# Patient Record
Sex: Female | Born: 1950 | Race: Black or African American | Hispanic: No | Marital: Married | Smoking: Never smoker
Health system: Southern US, Community
[De-identification: ages and names within clinical notes are randomized; demographics above are authoritative.]

## PROBLEM LIST (undated history)

## (undated) DIAGNOSIS — L409 Psoriasis, unspecified: Secondary | ICD-10-CM

## (undated) DIAGNOSIS — D649 Anemia, unspecified: Secondary | ICD-10-CM

## (undated) DIAGNOSIS — C801 Malignant (primary) neoplasm, unspecified: Secondary | ICD-10-CM

## (undated) DIAGNOSIS — E785 Hyperlipidemia, unspecified: Secondary | ICD-10-CM

## (undated) DIAGNOSIS — R010 Benign and innocent cardiac murmurs: Secondary | ICD-10-CM

## (undated) HISTORY — DX: Benign and innocent cardiac murmurs: R01.0

## (undated) HISTORY — PX: TUBAL LIGATION: SHX77

## (undated) HISTORY — DX: Malignant (primary) neoplasm, unspecified: C80.1

## (undated) HISTORY — DX: Hyperlipidemia, unspecified: E78.5

## (undated) HISTORY — PX: OTHER SURGICAL HISTORY: SHX169

## (undated) HISTORY — DX: Anemia, unspecified: D64.9

---

## 2012-08-23 HISTORY — PX: INCISION AND DRAINAGE INTRA ORAL ABSCESS: SHX1802

## 2012-11-13 ENCOUNTER — Other Ambulatory Visit: Payer: Self-pay | Admitting: Adult Health

## 2012-11-30 ENCOUNTER — Ambulatory Visit (INDEPENDENT_AMBULATORY_CARE_PROVIDER_SITE_OTHER): Payer: BC Managed Care – PPO | Admitting: Adult Health

## 2012-11-30 ENCOUNTER — Other Ambulatory Visit (HOSPITAL_COMMUNITY)
Admission: RE | Admit: 2012-11-30 | Discharge: 2012-11-30 | Disposition: A | Discharge status: Home / Self Care | Payer: BC Managed Care – PPO | Source: Ambulatory Visit | Attending: Adult Health | Admitting: Adult Health

## 2012-11-30 ENCOUNTER — Encounter: Payer: Self-pay | Admitting: Adult Health

## 2012-11-30 VITALS — BP 132/90 | HR 88 | Ht 65.0 in | Wt 200.0 lb

## 2012-11-30 DIAGNOSIS — Z01419 Encounter for gynecological examination (general) (routine) without abnormal findings: Secondary | ICD-10-CM | POA: Insufficient documentation

## 2012-11-30 DIAGNOSIS — Z0000 Encounter for general adult medical examination without abnormal findings: Secondary | ICD-10-CM

## 2012-11-30 DIAGNOSIS — Z1151 Encounter for screening for human papillomavirus (HPV): Secondary | ICD-10-CM | POA: Insufficient documentation

## 2012-11-30 DIAGNOSIS — Z1212 Encounter for screening for malignant neoplasm of rectum: Secondary | ICD-10-CM

## 2012-11-30 LAB — HEMOCCULT GUIAC POC 1CARD (OFFICE): Fecal Occult Blood, POC: NEGATIVE

## 2012-11-30 NOTE — Progress Notes (Signed)
Patient ID: Laurie Duarte, female   DOB: 05/14/1951, 62 y.o.   MRN: [id number] History of Present Illness: Laurie Duarte is 62 year old black female, married in today for pap and physical, she said her last one was about 15 years ago. She says she has not had insurance til recently.She is getting a PCP soon.  Current Medications, Allergies, Past Medical History, Past Surgical History, Family History and Social History were reviewed in [employer] record.     Review of Systems:Patient denies any headaches, blurred vision, shortness of breath, chest pain, abdominal pain, problems with bowel movements, urination, or intercourse. She did have recent oral surgery for abscess. Her knees swell if she walks too much, and she does wake up at nite but says it is her husbands fault, because he comes to bed late. No mood changes  Physical Exam:Blood pressure 132/90, pulse 88, height 5\' 5"  (1.651 m), weight 200 lb (90.719 kg).BMI: 33.28. General:  Well developed, well nourished, no acute distress Skin:  Warm and dry Neck:  Midline trachea, normal thyroid, no carotid bruits heard. Lungs; Clear to auscultation bilaterally Breast:  No dominant palpable mass, retraction, or nipple discharge Cardiovascular: Regular rate and rhythm Abdomen:  Soft, non tender, no hepatosplenomegaly Pelvic:  External genitalia is normal in appearance.  The vagina is normal in appearance, she does have loss of rugae but good color and moisture.  The cervix is bulbous. Pap performed with HPV. Uterus is felt to be normal size, shape, and contour.  No adnexal masses or tenderness noted. Rectal: Good sphincter tone, no polyps, or hemorrhoids felt.  Hemoccult negative. Extremities:  No swelling or varicosities noted Psych:  No mood changes   Impression: Yearly exam Post menopausal female  Plan:Mammogram advised now and yearly Colonoscopy advised Order given for fasting labs to consist of CBC, CMP,TSH, Lipid profile to get  in near future. Return to clinic in 1 year for physical

## 2012-11-30 NOTE — Patient Instructions (Addendum)
Get mammogram Think about colonoscopy  Get fasting labs in near future Sign up for my chart.

## 2012-12-04 ENCOUNTER — Other Ambulatory Visit: Payer: Self-pay | Admitting: Adult Health

## 2012-12-04 LAB — COMPREHENSIVE METABOLIC PANEL
ALT: 15 U/L (ref 0–35)
Alkaline Phosphatase: 46 U/L (ref 39–117)
CO2: 26 mEq/L (ref 19–32)
Creat: 0.82 mg/dL (ref 0.50–1.10)
Total Bilirubin: 0.4 mg/dL (ref 0.3–1.2)

## 2012-12-04 LAB — CBC
MCH: 25.5 pg — ABNORMAL LOW (ref 26.0–34.0)
MCHC: 31.8 g/dL (ref 30.0–36.0)
MCV: 80.2 fL (ref 78.0–100.0)
Platelets: 340 10*3/uL (ref 150–400)
RBC: 4.24 MIL/uL (ref 3.87–5.11)
RDW: 15.9 % — ABNORMAL HIGH (ref 11.5–15.5)

## 2012-12-04 LAB — LIPID PANEL
Cholesterol: 226 mg/dL — ABNORMAL HIGH (ref 0–200)
Total CHOL/HDL Ratio: 4.3 Ratio
Triglycerides: 84 mg/dL (ref <150)
VLDL: 17 mg/dL (ref 0–40)

## 2012-12-05 ENCOUNTER — Telehealth: Payer: Self-pay | Admitting: Adult Health

## 2012-12-05 NOTE — Telephone Encounter (Signed)
Called pt and reviewed labs I recommend a multi vitamin with iron and krell oil and increased activity

## 2012-12-05 NOTE — Telephone Encounter (Signed)
Left message to call about lab results.

## 2013-05-07 ENCOUNTER — Other Ambulatory Visit: Payer: Self-pay | Admitting: Gastroenterology

## 2013-05-07 ENCOUNTER — Telehealth (INDEPENDENT_AMBULATORY_CARE_PROVIDER_SITE_OTHER): Payer: Self-pay

## 2013-05-07 ENCOUNTER — Ambulatory Visit
Admission: RE | Admit: 2013-05-07 | Discharge: 2013-05-07 | Disposition: A | Discharge status: Home / Self Care | Payer: BC Managed Care – PPO | Source: Ambulatory Visit | Attending: Gastroenterology | Admitting: Gastroenterology

## 2013-05-07 DIAGNOSIS — K6389 Other specified diseases of intestine: Secondary | ICD-10-CM

## 2013-05-07 DIAGNOSIS — R1031 Right lower quadrant pain: Secondary | ICD-10-CM

## 2013-05-07 LAB — BUN: BUN: 7 mg/dL (ref 6–23)

## 2013-05-07 LAB — CREATININE, SERUM: Creat: 0.7 mg/dL (ref 0.50–1.10)

## 2013-05-07 MED ORDER — IOHEXOL 300 MG/ML  SOLN
125.0000 mL | Freq: Once | INTRAMUSCULAR | Status: AC | PRN
  Administered 2013-05-07: 125 mL via INTRAVENOUS

## 2013-05-07 NOTE — Telephone Encounter (Signed)
Called and spoke to patient with appointment date & time w/Dr. Biagio Quint on 05/11/13 @ 9:45 am.

## 2013-05-07 NOTE — Telephone Encounter (Signed)
Called and attempted to left voice message for Misty Stanley (Dr. Kenna Gilbert nurse) to request medical records on patient.  Left on hold for 6.18 minutes, will attempt at a later time.

## 2013-05-08 ENCOUNTER — Other Ambulatory Visit (INDEPENDENT_AMBULATORY_CARE_PROVIDER_SITE_OTHER): Payer: Self-pay

## 2013-05-09 ENCOUNTER — Other Ambulatory Visit (INDEPENDENT_AMBULATORY_CARE_PROVIDER_SITE_OTHER): Payer: Self-pay

## 2013-05-11 ENCOUNTER — Ambulatory Visit (INDEPENDENT_AMBULATORY_CARE_PROVIDER_SITE_OTHER): Payer: BC Managed Care – PPO | Admitting: General Surgery

## 2013-05-11 ENCOUNTER — Encounter (INDEPENDENT_AMBULATORY_CARE_PROVIDER_SITE_OTHER): Payer: Self-pay | Admitting: General Surgery

## 2013-05-11 VITALS — BP 140/90 | HR 78 | Temp 97.8°F | Resp 14 | Ht 65.0 in | Wt 199.2 lb

## 2013-05-11 DIAGNOSIS — C189 Malignant neoplasm of colon, unspecified: Secondary | ICD-10-CM

## 2013-05-11 DIAGNOSIS — Z86010 Personal history of colonic polyps: Secondary | ICD-10-CM

## 2013-05-11 NOTE — Progress Notes (Signed)
Patient ID: Laurie Duarte, female   DOB: 09/18/1950, 62 y.o.   MRN: [id number]  Chief Complaint  Patient presents with  . New Evaluation    eval colon mass    HPI Laurie Duarte is a 62 y.o. female.  This patient is referred by Dr. 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 These were found on routine screening colonoscopy. She does have a history of lower abdominal pain which had become more concentrated in the right lower quadrant which began around April or May of this year. She described these pains as "labor pains" but these have been resolved now for about 3 weeks since she began taking acidophilus as recommended by her gastroenterologist. She says that her bowels are normally regular moving her bowels about once per day although they have been about every other day more recently. She also says that she has had a change in caliber of her stools to a thinner caliber stool which started in May. She denies any family history of colon cancer or any systemic symptoms such as weight loss. On her colonoscopy earlier this week she was noted to have a large sigmoid colon cancer located about 42-52 cm on the scope and pathology was consistent with malignancy. She was also found to have several other small colon polyps in the rectum which were removed as well as some descending colon polyps and cecal mass which was also biopsied and consistent with tubulovillous adenoma. She has been doing well since her colonoscopy.  She denies any blood in the stool or melena.  Her CEA level was normal. She also had a CT scan of the abdomen and pelvis which demonstrated the known colon masses as well as a right-sided hepatic lesion which was favored to be a hepatic cyst HPI  Past Medical History  Diagnosis Date  . Benign heart murmur   . Anemia   . Cancer     colon  . Hyperlipidemia     Past Surgical History  Procedure Laterality Date  . Tubal ligation    . Polyp removal       nasal cavities  . Incision and drainage intra oral abscess  1 2014    Family History  Problem Relation Age of Onset  . Diabetes Mother   . Hypertension Mother   . Heart disease Mother   . Heart disease Father   . Asthma Father   . Diabetes Maternal Grandmother   . Heart disease Brother   . Lupus Daughter   . Mental illness Daughter     ptsd    Social History History  Substance Use Topics  . Smoking status: Never Smoker   . Smokeless tobacco: Never Used  . Alcohol Use: Yes     Comment: occ.    No Known Allergies  Current Outpatient Prescriptions  Medication Sig Dispense Refill  . ACAI PO Take by mouth.      . calcium-vitamin D (OSCAL WITH D) 500-200 MG-UNIT per tablet Take 1 tablet by mouth.      . COD LIVER OIL PO Take by mouth.      . Lactobacillus (ACIDOPHOLUS PO) Take by mouth.       No current facility-administered medications for this visit.    Review of Systems Review of Systems All other review of systems negative or noncontributory except as stated in the HPI  Blood pressure 140/90, pulse 78, temperature 97.8 F (36.6 C), temperature source Temporal, resp. rate 14, height 5'  5" (1.651 m), weight 199 lb 3.2 oz (90.357 kg).  Physical Exam Physical Exam Physical Exam  Nursing note and vitals reviewed. Constitutional: She is oriented to person, place, and time. She appears well-developed and well-nourished. No distress.  HENT:  Head: Normocephalic and atraumatic.  Mouth/Throat: No oropharyngeal exudate.  Eyes: Conjunctivae and EOM are normal. Pupils are equal, round, and reactive to light. Right eye exhibits no discharge. Left eye exhibits no discharge. No scleral icterus.  Neck: Normal range of motion. Neck supple. No tracheal deviation present.  Cardiovascular: Normal rate, regular rhythm, normal heart sounds and intact distal pulses.   Pulmonary/Chest: Effort normal and breath sounds normal. No stridor. No respiratory distress. She has no wheezes.   Abdominal: Soft. Bowel sounds are normal. She exhibits no distension and no mass. There is no tenderness. There is no rebound and no guarding.  Musculoskeletal: Normal range of motion. She exhibits no edema and no tenderness.  Neurological: She is alert and oriented to person, place, and time.  Skin: Skin is warm and dry. No rash noted. She is not diaphoretic. No erythema. No pallor.  Psychiatric: She has a normal mood and affect. Her behavior is normal. Judgment and thought content normal.    Data Reviewed Cscope report, path, CT scan, outside records  Assessment    Sigmoid colon cancer Right-sided colon polyps She certainly will require sigmoid colectomy for management of this sigmoid colon mass and colon cancer. However, complicating her situation is the right-sided tubulovillous adenomas and she will likely need right hemicolectomy for this as well. I discussed with her the options for 2 segmental resections with 2 anastomoses versus the option of subtotal colectomy and we have decided to proceed with 2 segmental resections with 2 anastomoses. I discussed with her the pros and cons of each options and the risks of each option. I explained that she will have to anastomoses with the risk of leakage of with each anastomosis but think that I would favor this options over the increased morbidity of an infrequent bowel movements associated with subtotal colectomy. I discussed with her the options for laparoscopic or open colectomy and this will most likely require open colectomy but I would consider a laparoscopy as well as especially to evaluate this liver lesion. I discussed with her the risks of surgery including infection, bleeding, pain, scarring, recurrent cancer, anastomotic leak and the possible need for colostomy for diverting ostomy, need for open surgery, injury to the ureter or surrounding structures, and death and she expressed understanding and would like to proceed with sigmoid colectomy  and right hemicolectomy. I would like to again discuss her case with her gastroenterologist as she may require tattoo of the lesions and we could plan colonoscopy with tattooing and subsequent colectomy and I will discuss this with her gastroenterologist prior to scheduling her procedure     Plan    I will discuss with Dr. Loreta Ave the possibility of repeat colonoscopy with tattoo versus going straight to surgery        Aerik Polan DAVID 05/11/2013, 12:20 PM

## 2013-05-14 ENCOUNTER — Telehealth: Payer: Self-pay | Admitting: Adult Health

## 2013-05-14 NOTE — Telephone Encounter (Signed)
Called pt to check on her, had a colonoscopy that showed mass? Colon cancer to have surgery soon with Dr Priscille Heidelberg in [place]

## 2013-05-15 ENCOUNTER — Other Ambulatory Visit (INDEPENDENT_AMBULATORY_CARE_PROVIDER_SITE_OTHER): Payer: Self-pay | Admitting: General Surgery

## 2013-05-15 DIAGNOSIS — K6389 Other specified diseases of intestine: Secondary | ICD-10-CM

## 2013-05-21 ENCOUNTER — Encounter (INDEPENDENT_AMBULATORY_CARE_PROVIDER_SITE_OTHER): Payer: Self-pay

## 2013-05-24 ENCOUNTER — Encounter (HOSPITAL_COMMUNITY): Payer: Self-pay | Admitting: Pharmacy Technician

## 2013-05-25 ENCOUNTER — Encounter (HOSPITAL_COMMUNITY)
Admission: RE | Admit: 2013-05-25 | Discharge: 2013-05-25 | Disposition: A | Discharge status: Home / Self Care | Payer: BC Managed Care – PPO | Source: Ambulatory Visit | Attending: General Surgery | Admitting: General Surgery

## 2013-05-25 ENCOUNTER — Encounter (HOSPITAL_COMMUNITY): Payer: Self-pay

## 2013-05-25 VITALS — BP 135/82 | HR 75 | Temp 97.7°F | Resp 16 | Ht 65.0 in | Wt 200.5 lb

## 2013-05-25 DIAGNOSIS — Z01818 Encounter for other preprocedural examination: Secondary | ICD-10-CM | POA: Insufficient documentation

## 2013-05-25 DIAGNOSIS — Z01812 Encounter for preprocedural laboratory examination: Secondary | ICD-10-CM | POA: Insufficient documentation

## 2013-05-25 DIAGNOSIS — K6389 Other specified diseases of intestine: Secondary | ICD-10-CM

## 2013-05-25 HISTORY — DX: Psoriasis, unspecified: L40.9

## 2013-05-25 LAB — CBC
Platelets: 337 10*3/uL (ref 150–400)
RBC: 4.19 MIL/uL (ref 3.87–5.11)
RDW: 14.8 % (ref 11.5–15.5)
WBC: 7.6 10*3/uL (ref 4.0–10.5)

## 2013-05-25 LAB — BASIC METABOLIC PANEL
CO2: 26 mEq/L (ref 19–32)
Chloride: 104 mEq/L (ref 96–112)
GFR calc Af Amer: >90 mL/min (ref >90)
Sodium: 139 mEq/L (ref 135–145)

## 2013-05-25 NOTE — Patient Instructions (Addendum)
Jalessa Peyser  05/25/2013                           YOUR PROCEDURE IS SCHEDULED ON:  06/01/13               PLEASE REPORT TO SHORT STAY CENTER AT : 10:00 AM               CALL THIS NUMBER IF ANY PROBLEMS THE DAY OF SURGERY :               [phone]                      REMEMBER:   Do not eat food or drink liquids AFTER MIDNIGHT  May have clear liquids UNTIL 6 HOURS BEFORE SURGERY  (6:30 AM)  Clear liquids include soda, tea, black coffee, apple or grape juice, broth.  Take these medicines the morning of surgery with A SIP OF WATER:   Do not wear jewelry, make-up   Do not wear lotions, powders, or perfumes.   Do not shave legs or underarms 12 hrs. before surgery (men may shave face)  Do not bring valuables to the hospital.  Contacts, dentures or bridgework may not be worn into surgery.  Leave suitcase in the car. After surgery it may be brought to your room.  For patients admitted to the hospital more than one night, checkout time is 11:00                          The day of discharge.   Patients discharged the day of surgery will not be allowed to drive home                             If going home same day of surgery, must have someone stay with you first                           24 hrs at home and arrange for some one to drive you home from hospital.    Special Instructions:   Please read over the following fact sheets that you were given:               1. STOP ASPIRIN  AND HERBAL MEDS  7 DAYS PREOP                      2. [place] PREPARING FOR SURGERY SHEET               3. FOLLOW BOWEL PREP                                                X_____________________________________________________________________        Failure to follow these instructions may result in cancellation of your surgery

## 2013-05-31 MED ORDER — SODIUM CHLORIDE 0.9 % IV SOLN
1.0000 g | Freq: Once | INTRAVENOUS | Status: AC
  Administered 2013-06-01: 1 g via INTRAVENOUS
  Filled 2013-05-31: qty 1

## 2013-06-01 ENCOUNTER — Inpatient Hospital Stay (HOSPITAL_COMMUNITY)
Admission: RE | Admit: 2013-06-01 | Discharge: 2013-06-07 | DRG: 149 | Disposition: A | Discharge status: Home / Self Care | Payer: BC Managed Care – PPO | Source: Ambulatory Visit | Attending: General Surgery | Admitting: General Surgery

## 2013-06-01 ENCOUNTER — Encounter (HOSPITAL_COMMUNITY): Payer: BC Managed Care – PPO | Admitting: Anesthesiology

## 2013-06-01 ENCOUNTER — Inpatient Hospital Stay (HOSPITAL_COMMUNITY): Payer: BC Managed Care – PPO | Admitting: Anesthesiology

## 2013-06-01 ENCOUNTER — Encounter (HOSPITAL_COMMUNITY)
Admission: RE | Disposition: A | Discharge status: Home / Self Care | Payer: Self-pay | Source: Ambulatory Visit | Attending: General Surgery

## 2013-06-01 ENCOUNTER — Encounter (HOSPITAL_COMMUNITY): Payer: Self-pay | Admitting: *Deleted

## 2013-06-01 DIAGNOSIS — D126 Benign neoplasm of colon, unspecified: Secondary | ICD-10-CM | POA: Diagnosis present

## 2013-06-01 DIAGNOSIS — Z79899 Other long term (current) drug therapy: Secondary | ICD-10-CM

## 2013-06-01 DIAGNOSIS — K635 Polyp of colon: Secondary | ICD-10-CM | POA: Diagnosis present

## 2013-06-01 DIAGNOSIS — E785 Hyperlipidemia, unspecified: Secondary | ICD-10-CM | POA: Diagnosis present

## 2013-06-01 DIAGNOSIS — Q438 Other specified congenital malformations of intestine: Secondary | ICD-10-CM

## 2013-06-01 DIAGNOSIS — C187 Malignant neoplasm of sigmoid colon: Secondary | ICD-10-CM

## 2013-06-01 DIAGNOSIS — Z860100 Personal history of colon polyps, unspecified: Secondary | ICD-10-CM

## 2013-06-01 DIAGNOSIS — K6389 Other specified diseases of intestine: Secondary | ICD-10-CM

## 2013-06-01 DIAGNOSIS — R011 Cardiac murmur, unspecified: Secondary | ICD-10-CM | POA: Diagnosis present

## 2013-06-01 HISTORY — PX: APPLICATION OF WOUND VAC: SHX5189

## 2013-06-01 HISTORY — PX: PARTIAL COLECTOMY: SHX5273

## 2013-06-01 LAB — TYPE AND SCREEN: ABO/RH(D): A POS

## 2013-06-01 LAB — ABO/RH: ABO/RH(D): A POS

## 2013-06-01 SURGERY — COLECTOMY, PARTIAL
Anesthesia: General | Site: Abdomen | Wound class: Clean Contaminated

## 2013-06-01 MED ORDER — SODIUM CHLORIDE 0.9 % IV SOLN
INTRAVENOUS | Status: AC
  Filled 2013-06-01: qty 1

## 2013-06-01 MED ORDER — ENOXAPARIN SODIUM 40 MG/0.4ML ~~LOC~~ SOLN
40.0000 mg | SUBCUTANEOUS | Status: DC
  Administered 2013-06-02 – 2013-06-07 (×6): 40 mg via SUBCUTANEOUS
  Filled 2013-06-01 (×8): qty 0.4

## 2013-06-01 MED ORDER — PEG 3350-KCL-NA BICARB-NACL 420 G PO SOLR: 4000.0000 mL | Freq: Once | ORAL | Status: DC

## 2013-06-01 MED ORDER — SODIUM CHLORIDE 0.9 % IV SOLN
1.0000 g | Freq: Once | INTRAVENOUS | Status: AC
  Administered 2013-06-02: 1 g via INTRAVENOUS
  Filled 2013-06-01: qty 1

## 2013-06-01 MED ORDER — MORPHINE SULFATE (PF) 1 MG/ML IV SOLN
INTRAVENOUS | Status: AC
  Filled 2013-06-01: qty 25

## 2013-06-01 MED ORDER — PROPOFOL 10 MG/ML IV BOLUS
INTRAVENOUS | Status: DC | PRN
  Administered 2013-06-01: 150 mg via INTRAVENOUS

## 2013-06-01 MED ORDER — ONDANSETRON HCL 4 MG/2ML IJ SOLN: 4.0000 mg | Freq: Four times a day (QID) | INTRAMUSCULAR | Status: DC | PRN

## 2013-06-01 MED ORDER — LACTATED RINGERS IV SOLN
INTRAVENOUS | Status: DC
  Administered 2013-06-01: 1000 mL via INTRAVENOUS

## 2013-06-01 MED ORDER — HYDROMORPHONE HCL PF 1 MG/ML IJ SOLN
0.2500 mg | INTRAMUSCULAR | Status: DC | PRN
  Administered 2013-06-01 (×4): 0.5 mg via INTRAVENOUS

## 2013-06-01 MED ORDER — MIDAZOLAM HCL 5 MG/5ML IJ SOLN
INTRAMUSCULAR | Status: DC | PRN
  Administered 2013-06-01: 2 mg via INTRAVENOUS

## 2013-06-01 MED ORDER — LACTATED RINGERS IV SOLN: INTRAVENOUS | Status: DC

## 2013-06-01 MED ORDER — ONDANSETRON HCL 4 MG/2ML IJ SOLN
INTRAMUSCULAR | Status: DC | PRN
  Administered 2013-06-01: 4 mg via INTRAMUSCULAR

## 2013-06-01 MED ORDER — MORPHINE SULFATE (PF) 1 MG/ML IV SOLN
INTRAVENOUS | Status: DC
  Administered 2013-06-01: 10.5 mg via INTRAVENOUS
  Administered 2013-06-01: 17:00:00 via INTRAVENOUS
  Administered 2013-06-02: 4.03 mg via INTRAVENOUS
  Administered 2013-06-02: 3 mg via INTRAVENOUS
  Administered 2013-06-02 (×2): via INTRAVENOUS
  Administered 2013-06-02: 4.5 mg via INTRAVENOUS
  Administered 2013-06-02: 15 mg via INTRAVENOUS
  Administered 2013-06-02: 6.52 mg via INTRAVENOUS
  Administered 2013-06-02: 4.5 mg via INTRAVENOUS
  Administered 2013-06-03: 7.5 mg via INTRAVENOUS
  Administered 2013-06-03: 10.5 mg via INTRAVENOUS
  Administered 2013-06-03: 7.5 mg via INTRAVENOUS
  Administered 2013-06-03: 10:00:00 via INTRAVENOUS
  Administered 2013-06-03: 4.5 mg via INTRAVENOUS
  Administered 2013-06-03: 6 mg via INTRAVENOUS
  Administered 2013-06-04: 0.597 mg via INTRAVENOUS
  Administered 2013-06-04: 08:00:00 via INTRAVENOUS
  Administered 2013-06-04: 6 mg via INTRAVENOUS
  Administered 2013-06-04: 4.5 mg via INTRAVENOUS
  Filled 2013-06-01 (×4): qty 25

## 2013-06-01 MED ORDER — SODIUM CHLORIDE 0.9 % IR SOLN
Status: DC | PRN
  Administered 2013-06-01: 4000 mL

## 2013-06-01 MED ORDER — NALOXONE HCL 0.4 MG/ML IJ SOLN: 0.4000 mg | INTRAMUSCULAR | Status: DC | PRN

## 2013-06-01 MED ORDER — DIPHENHYDRAMINE HCL 50 MG/ML IJ SOLN: 12.5000 mg | Freq: Four times a day (QID) | INTRAMUSCULAR | Status: DC | PRN

## 2013-06-01 MED ORDER — DIPHENHYDRAMINE HCL 12.5 MG/5ML PO ELIX: 12.5000 mg | ORAL_SOLUTION | Freq: Four times a day (QID) | ORAL | Status: DC | PRN

## 2013-06-01 MED ORDER — KCL IN DEXTROSE-NACL 20-5-0.45 MEQ/L-%-% IV SOLN
INTRAVENOUS | Status: DC
  Administered 2013-06-02: 125 mL/h via INTRAVENOUS
  Administered 2013-06-02: 01:00:00 via INTRAVENOUS
  Filled 2013-06-01 (×3): qty 1000

## 2013-06-01 MED ORDER — NEOSTIGMINE METHYLSULFATE 1 MG/ML IJ SOLN
INTRAMUSCULAR | Status: DC | PRN
  Administered 2013-06-01: 4 mg via INTRAVENOUS

## 2013-06-01 MED ORDER — HEPARIN SODIUM (PORCINE) 5000 UNIT/ML IJ SOLN
5000.0000 [IU] | Freq: Once | INTRAMUSCULAR | Status: AC
  Administered 2013-06-01: 5000 [IU] via SUBCUTANEOUS
  Filled 2013-06-01: qty 1

## 2013-06-01 MED ORDER — FENTANYL CITRATE 0.05 MG/ML IJ SOLN
INTRAMUSCULAR | Status: DC | PRN
  Administered 2013-06-01 (×2): 100 ug via INTRAVENOUS
  Administered 2013-06-01 (×4): 50 ug via INTRAVENOUS
  Administered 2013-06-01: 100 ug via INTRAVENOUS

## 2013-06-01 MED ORDER — CHLORHEXIDINE GLUCONATE 4 % EX LIQD
1.0000 "application " | Freq: Once | CUTANEOUS | Status: DC
  Filled 2013-06-01: qty 15

## 2013-06-01 MED ORDER — ROCURONIUM BROMIDE 100 MG/10ML IV SOLN
INTRAVENOUS | Status: DC | PRN
  Administered 2013-06-01: 10 mg via INTRAVENOUS
  Administered 2013-06-01: 20 mg via INTRAVENOUS
  Administered 2013-06-01: 50 mg via INTRAVENOUS

## 2013-06-01 MED ORDER — ONDANSETRON HCL 4 MG PO TABS: 4.0000 mg | ORAL_TABLET | Freq: Four times a day (QID) | ORAL | Status: DC | PRN

## 2013-06-01 MED ORDER — HYDROMORPHONE HCL PF 1 MG/ML IJ SOLN
INTRAMUSCULAR | Status: AC
  Filled 2013-06-01: qty 1

## 2013-06-01 MED ORDER — SODIUM CHLORIDE 0.9 % IJ SOLN: 9.0000 mL | INTRAMUSCULAR | Status: DC | PRN

## 2013-06-01 MED ORDER — LACTATED RINGERS IV SOLN
INTRAVENOUS | Status: DC | PRN
  Administered 2013-06-01 (×3): via INTRAVENOUS

## 2013-06-01 MED ORDER — GLYCOPYRROLATE 0.2 MG/ML IJ SOLN
INTRAMUSCULAR | Status: DC | PRN
  Administered 2013-06-01: 0.6 mg via INTRAVENOUS

## 2013-06-01 SURGICAL SUPPLY — 57 items
APPLICATOR COTTON TIP 6IN STRL (MISCELLANEOUS) IMPLANT
BLADE EXTENDED COATED 6.5IN (ELECTRODE) ×2 IMPLANT
BLADE HEX COATED 2.75 (ELECTRODE) ×2 IMPLANT
BLADE SURG SZ10 CARB STEEL (BLADE) ×2 IMPLANT
CANISTER SUCTION 2500CC (MISCELLANEOUS) ×4 IMPLANT
CLIP TI LARGE 6 (CLIP) IMPLANT
CLOTH BEACON ORANGE TIMEOUT ST (SAFETY) IMPLANT
COVER MAYO STAND STRL (DRAPES) ×2 IMPLANT
DRAPE LAPAROSCOPIC ABDOMINAL (DRAPES) ×2 IMPLANT
DRAPE LG THREE QUARTER DISP (DRAPES) IMPLANT
DRAPE WARM FLUID 44X44 (DRAPE) ×2 IMPLANT
DRSG PAD ABDOMINAL 8X10 ST (GAUZE/BANDAGES/DRESSINGS) IMPLANT
DRSG VAC ATS SM SENSATRAC (GAUZE/BANDAGES/DRESSINGS) ×2 IMPLANT
ELECT REM PT RETURN 9FT ADLT (ELECTROSURGICAL) ×2
ELECTRODE REM PT RTRN 9FT ADLT (ELECTROSURGICAL) ×1 IMPLANT
ENSEAL DEVICE STD TIP 35CM (ENDOMECHANICALS) ×2 IMPLANT
GAUZE XEROFORM 5X9 LF (GAUZE/BANDAGES/DRESSINGS) ×2 IMPLANT
GLOVE BIOGEL PI IND STRL 7.0 (GLOVE) ×5 IMPLANT
GLOVE BIOGEL PI INDICATOR 7.0 (GLOVE) ×5
GLOVE SURG SIGNA 7.5 PF LTX (GLOVE) ×8 IMPLANT
GLOVE SURG SS PI 7.5 STRL IVOR (GLOVE) ×10 IMPLANT
GOWN PREVENTION PLUS LG XLONG (DISPOSABLE) ×4 IMPLANT
GOWN SRG XL XLNG 56XLVL 4 (GOWN DISPOSABLE) ×3 IMPLANT
GOWN STRL NON-REIN XL XLG LVL4 (GOWN DISPOSABLE) ×3
GOWN STRL REIN 2XL XLG LVL4 (GOWN DISPOSABLE) ×4 IMPLANT
GOWN STRL REIN XL XLG (GOWN DISPOSABLE) ×16 IMPLANT
KIT BASIN OR (CUSTOM PROCEDURE TRAY) ×2 IMPLANT
LEGGING LITHOTOMY PAIR STRL (DRAPES) ×2 IMPLANT
LIGASURE IMPACT 36 18CM CVD LR (INSTRUMENTS) ×2 IMPLANT
NS IRRIG 1000ML POUR BTL (IV SOLUTION) ×8 IMPLANT
PACK GENERAL/GYN (CUSTOM PROCEDURE TRAY) ×2 IMPLANT
RELOAD AUTO 90-3.5 TA90 BLE (ENDOMECHANICALS) ×4 IMPLANT
RELOAD PROXIMATE 75MM BLUE (ENDOMECHANICALS) ×8 IMPLANT
SCALPEL HARMONIC ACE (MISCELLANEOUS) IMPLANT
SPONGE GAUZE 4X4 12PLY (GAUZE/BANDAGES/DRESSINGS) IMPLANT
STAPLER 90 3.5 STAND SLIM (STAPLE) ×2
STAPLER 90 3.5 STD SLIM (STAPLE) ×1 IMPLANT
STAPLER PROXIMATE 75MM BLUE (STAPLE) ×4 IMPLANT
STAPLER VISISTAT 35W (STAPLE) ×4 IMPLANT
SUCTION POOLE TIP (SUCTIONS) ×2 IMPLANT
SUT NOV 1 T60/GS (SUTURE) IMPLANT
SUT NOVA NAB DX-16 0-1 5-0 T12 (SUTURE) IMPLANT
SUT NOVA T20/GS 25 (SUTURE) IMPLANT
SUT PDS AB 1 TP1 96 (SUTURE) ×4 IMPLANT
SUT SILK 2 0 (SUTURE) ×1
SUT SILK 2 0 SH (SUTURE) ×2 IMPLANT
SUT SILK 2 0 SH CR/8 (SUTURE) ×8 IMPLANT
SUT SILK 2 0SH CR/8 30 (SUTURE) IMPLANT
SUT SILK 2-0 18XBRD TIE 12 (SUTURE) ×1 IMPLANT
SUT SILK 2-0 30XBRD TIE 12 (SUTURE) IMPLANT
SUT SILK 3 0 (SUTURE)
SUT SILK 3 0 SH CR/8 (SUTURE) ×2 IMPLANT
SUT SILK 3-0 18XBRD TIE 12 (SUTURE) IMPLANT
TOWEL OR 17X26 10 PK STRL BLUE (TOWEL DISPOSABLE) ×6 IMPLANT
TRAY FOLEY CATH 14FRSI W/METER (CATHETERS) ×2 IMPLANT
WATER STERILE IRR 1500ML POUR (IV SOLUTION) IMPLANT
YANKAUER SUCT BULB TIP NO VENT (SUCTIONS) ×2 IMPLANT

## 2013-06-01 NOTE — Anesthesia Preprocedure Evaluation (Addendum)
Anesthesia Evaluation  Patient identified by MRN, date of birth, ID band Patient awake    Reviewed: Allergy & Precautions, H&P , NPO status , Patient's Chart, lab work & pertinent test results  Airway Mallampati: II TM Distance: >3 FB Neck ROM: full    Dental  (+) Edentulous Upper, Missing and Dental Advisory Given Missing lower front teeth:   Pulmonary neg pulmonary ROS,  breath sounds clear to auscultation  Pulmonary exam normal       Cardiovascular Exercise Tolerance: Good negative cardio ROS  Rhythm:regular Rate:Normal     Neuro/Psych negative neurological ROS  negative psych ROS   GI/Hepatic negative GI ROS, Neg liver ROS,   Endo/Other  negative endocrine ROS  Renal/GU negative Renal ROS  negative genitourinary   Musculoskeletal   Abdominal   Peds  Hematology negative hematology ROS (+)   Anesthesia Other Findings   Reproductive/Obstetrics negative OB ROS                          Anesthesia Physical Anesthesia Plan  ASA: I  Anesthesia Plan: General   Post-op Pain Management:    Induction: Intravenous  Airway Management Planned: Oral ETT  Additional Equipment:   Intra-op Plan:   Post-operative Plan: Extubation in OR  Informed Consent: I have reviewed the patients History and Physical, chart, labs and discussed the procedure including the risks, benefits and alternatives for the proposed anesthesia with the patient or authorized representative who has indicated his/her understanding and acceptance.   Dental Advisory Given  Plan Discussed with: CRNA and Surgeon  Anesthesia Plan Comments:         Anesthesia Quick Evaluation

## 2013-06-01 NOTE — Preoperative (Signed)
Beta Blockers   Reason not to administer Beta Blockers:Not Applicable 

## 2013-06-01 NOTE — Transfer of Care (Signed)
Immediate Anesthesia Transfer of Care Note  Patient: Laurie Duarte  Procedure(s) Performed: Procedure(s): SIGMOID COLECTOMY AND RIGHT  COLECTOMY (N/A) APPLICATION OF WOUND VAC (N/A)  Patient Location: PACU  Anesthesia Type:General  Level of Consciousness: awake, alert  and oriented  Airway & Oxygen Therapy: Patient Spontanous Breathing and Patient connected to face mask oxygen  Post-op Assessment: Report given to PACU RN and Post -op Vital signs reviewed and stable  Post vital signs: Reviewed and stable  Complications: No apparent anesthesia complications

## 2013-06-01 NOTE — H&P (View-Only) (Signed)
Patient ID: Laurie Duarte, female   DOB: Jan 08, 1951, 62 y.o.   MRN: [id number]  Chief Complaint  Patient presents with  . New Evaluation    eval colon mass    HPI Laurie Duarte is a 62 y.o. female.  This patient is referred by Dr. 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 These were found on routine screening colonoscopy. She does have a history of lower abdominal pain which had become more concentrated in the right lower quadrant which began around April or May of this year. She described these pains as "labor pains" but these have been resolved now for about 3 weeks since she began taking acidophilus as recommended by her gastroenterologist. She says that her bowels are normally regular moving her bowels about once per day although they have been about every other day more recently. She also says that she has had a change in caliber of her stools to a thinner caliber stool which started in May. She denies any family history of colon cancer or any systemic symptoms such as weight loss. On her colonoscopy earlier this week she was noted to have a large sigmoid colon cancer located about 42-52 cm on the scope and pathology was consistent with malignancy. She was also found to have several other small colon polyps in the rectum which were removed as well as some descending colon polyps and cecal mass which was also biopsied and consistent with tubulovillous adenoma. She has been doing well since her colonoscopy.  She denies any blood in the stool or melena.  Her CEA level was normal. She also had a CT scan of the abdomen and pelvis which demonstrated the known colon masses as well as a right-sided hepatic lesion which was favored to be a hepatic cyst HPI  Past Medical History  Diagnosis Date  . Benign heart murmur   . Anemia   . Cancer     colon  . Hyperlipidemia     Past Surgical History  Procedure Laterality Date  . Tubal ligation    . Polyp removal       nasal cavities  . Incision and drainage intra oral abscess  1 2014    Family History  Problem Relation Age of Onset  . Diabetes Mother   . Hypertension Mother   . Heart disease Mother   . Heart disease Father   . Asthma Father   . Diabetes Maternal Grandmother   . Heart disease Brother   . Lupus Daughter   . Mental illness Daughter     ptsd    Social History History  Substance Use Topics  . Smoking status: Never Smoker   . Smokeless tobacco: Never Used  . Alcohol Use: Yes     Comment: occ.    No Known Allergies  Current Outpatient Prescriptions  Medication Sig Dispense Refill  . ACAI PO Take by mouth.      . calcium-vitamin D (OSCAL WITH D) 500-200 MG-UNIT per tablet Take 1 tablet by mouth.      . COD LIVER OIL PO Take by mouth.      . Lactobacillus (ACIDOPHOLUS PO) Take by mouth.       No current facility-administered medications for this visit.    Review of Systems Review of Systems All other review of systems negative or noncontributory except as stated in the HPI  Blood pressure 140/90, pulse 78, temperature 97.8 F (36.6 C), temperature source Temporal, resp. rate 14, height 5'  5" (1.651 m), weight 199 lb 3.2 oz (90.357 kg).  Physical Exam Physical Exam Physical Exam  Nursing note and vitals reviewed. Constitutional: She is oriented to person, place, and time. She appears well-developed and well-nourished. No distress.  HENT:  Head: Normocephalic and atraumatic.  Mouth/Throat: No oropharyngeal exudate.  Eyes: Conjunctivae and EOM are normal. Pupils are equal, round, and reactive to light. Right eye exhibits no discharge. Left eye exhibits no discharge. No scleral icterus.  Neck: Normal range of motion. Neck supple. No tracheal deviation present.  Cardiovascular: Normal rate, regular rhythm, normal heart sounds and intact distal pulses.   Pulmonary/Chest: Effort normal and breath sounds normal. No stridor. No respiratory distress. She has no wheezes.   Abdominal: Soft. Bowel sounds are normal. She exhibits no distension and no mass. There is no tenderness. There is no rebound and no guarding.  Musculoskeletal: Normal range of motion. She exhibits no edema and no tenderness.  Neurological: She is alert and oriented to person, place, and time.  Skin: Skin is warm and dry. No rash noted. She is not diaphoretic. No erythema. No pallor.  Psychiatric: She has a normal mood and affect. Her behavior is normal. Judgment and thought content normal.    Data Reviewed Cscope report, path, CT scan, outside records  Assessment    Sigmoid colon cancer Right-sided colon polyps She certainly will require sigmoid colectomy for management of this sigmoid colon mass and colon cancer. However, complicating her situation is the right-sided tubulovillous adenomas and she will likely need right hemicolectomy for this as well. I discussed with her the options for 2 segmental resections with 2 anastomoses versus the option of subtotal colectomy and we have decided to proceed with 2 segmental resections with 2 anastomoses. I discussed with her the pros and cons of each options and the risks of each option. I explained that she will have to anastomoses with the risk of leakage of with each anastomosis but think that I would favor this options over the increased morbidity of an infrequent bowel movements associated with subtotal colectomy. I discussed with her the options for laparoscopic or open colectomy and this will most likely require open colectomy but I would consider a laparoscopy as well as especially to evaluate this liver lesion. I discussed with her the risks of surgery including infection, bleeding, pain, scarring, recurrent cancer, anastomotic leak and the possible need for colostomy for diverting ostomy, need for open surgery, injury to the ureter or surrounding structures, and death and she expressed understanding and would like to proceed with sigmoid colectomy  and right hemicolectomy. I would like to again discuss her case with her gastroenterologist as she may require tattoo of the lesions and we could plan colonoscopy with tattooing and subsequent colectomy and I will discuss this with her gastroenterologist prior to scheduling her procedure     Plan    I will discuss with Dr. Loreta Ave the possibility of repeat colonoscopy with tattoo versus going straight to surgery        Samaria Anes DAVID 05/11/2013, 12:20 PM

## 2013-06-01 NOTE — Brief Op Note (Signed)
06/01/2013  4:18 PM  PATIENT:  Laurie Duarte  63 y.o. female  PRE-OPERATIVE DIAGNOSIS:  colon cancer  POST-OPERATIVE DIAGNOSIS:  COLON CANCER  PROCEDURE:  Procedure(s): SIGMOID COLECTOMY AND RIGHT  COLECTOMY (N/A) APPLICATION OF WOUND VAC (N/A)  SURGEON:  Surgeon(s) and Role:    * Shelly Rubenstein, MD - Assisting    * Lodema Pilot, DO - Primary  PHYSICIAN ASSISTANT:   ASSISTANTS: [place]   ANESTHESIA:   general  EBL:  Total I/O In: 2000 [I.V.:2000] Out: 350 [Urine:300; Blood:50]  BLOOD ADMINISTERED:none  DRAINS: incisional wound vac   LOCAL MEDICATIONS USED:  NONE  SPECIMEN:  Source of Specimen:  sigmoid colectomy (suture proximal), ileocecectomy and appendix  DISPOSITION OF SPECIMEN:  PATHOLOGY  COUNTS:  YES  TOURNIQUET:  * No tourniquets in log *  DICTATION: .Other Dictation: Dictation Number dictated  PLAN OF CARE: Admit to inpatient   PATIENT DISPOSITION:  PACU - hemodynamically stable.   Delay start of Pharmacological VTE agent (>24hrs) due to surgical blood loss or risk of bleeding: no

## 2013-06-01 NOTE — Interval H&P Note (Signed)
History and Physical Interval Note:  06/01/2013 1:19 PM  Laurie Duarte  has presented today for surgery, with the diagnosis of colon cancer  The various methods of treatment have been discussed with the patient and family. After consideration of risks, benefits and other options for treatment, the patient has consented to  Procedure(s): SIGMOID COLECTOMY AND RIGHT  COLECTOMY (N/A) as a surgical intervention .  The patient's history has been reviewed, patient examined, no change in status, stable for surgery.  I have reviewed the patient's chart and labs.  Questions were answered to the patient's satisfaction.  I have seen and evaluated the patient in the preop area.  Risks of the procedure again discussed.  She is interested in 2 segmental resections but I explained that she may get subtotal colectomy if only a short segment of colon remains after resections.  She understands the risks of infection, bleeding, leaks, diarrhea, recurrence, incontinence and injury to surrounding structures and desires to proceed with right colectomy and sigmoid colectomy and possible subtotal colectomy.   Lodema Pilot DAVID

## 2013-06-01 NOTE — Anesthesia Postprocedure Evaluation (Signed)
  Anesthesia Post-op Note  Patient: Laurie Duarte  Procedure(s) Performed: Procedure(s) (LRB): SIGMOID COLECTOMY AND RIGHT  COLECTOMY (N/A) APPLICATION OF WOUND VAC (N/A)  Patient Location: PACU  Anesthesia Type: General  Level of Consciousness: awake and alert   Airway and Oxygen Therapy: Patient Spontanous Breathing  Post-op Pain: mild  Post-op Assessment: Post-op Vital signs reviewed, Patient's Cardiovascular Status Stable, Respiratory Function Stable, Patent Airway and No signs of Nausea or vomiting  Last Vitals:  Filed Vitals:   06/01/13 1645  BP: 151/68  Pulse: 75  Temp:   Resp: 11    Post-op Vital Signs: stable   Complications: No apparent anesthesia complications

## 2013-06-02 DIAGNOSIS — K635 Polyp of colon: Secondary | ICD-10-CM | POA: Diagnosis present

## 2013-06-02 DIAGNOSIS — C187 Malignant neoplasm of sigmoid colon: Secondary | ICD-10-CM | POA: Diagnosis present

## 2013-06-02 LAB — CBC
HCT: 32 % — ABNORMAL LOW (ref 36.0–46.0)
MCV: 78.8 fL (ref 78.0–100.0)
Platelets: 308 10*3/uL (ref 150–400)
RBC: 4.06 MIL/uL (ref 3.87–5.11)
RDW: 15 % (ref 11.5–15.5)
WBC: 14.5 10*3/uL — ABNORMAL HIGH (ref 4.0–10.5)

## 2013-06-02 LAB — BASIC METABOLIC PANEL
CO2: 25 mEq/L (ref 19–32)
Calcium: 8.9 mg/dL (ref 8.4–10.5)
Chloride: 98 mEq/L (ref 96–112)
Creatinine, Ser: 0.65 mg/dL (ref 0.50–1.10)
GFR calc Af Amer: >90 mL/min (ref >90)
Potassium: 4 mEq/L (ref 3.5–5.1)
Sodium: 131 mEq/L — ABNORMAL LOW (ref 135–145)

## 2013-06-02 MED ORDER — CHLORHEXIDINE GLUCONATE 0.12 % MT SOLN
15.0000 mL | Freq: Two times a day (BID) | OROMUCOSAL | Status: DC
  Administered 2013-06-03 – 2013-06-07 (×7): 15 mL via OROMUCOSAL
  Filled 2013-06-02 (×13): qty 15

## 2013-06-02 MED ORDER — BIOTENE DRY MOUTH MT LIQD
15.0000 mL | Freq: Two times a day (BID) | OROMUCOSAL | Status: DC
  Administered 2013-06-02 – 2013-06-07 (×6): 15 mL via OROMUCOSAL

## 2013-06-02 MED ORDER — KCL IN DEXTROSE-NACL 20-5-0.9 MEQ/L-%-% IV SOLN
INTRAVENOUS | Status: DC
  Administered 2013-06-02: 125 mL/h via INTRAVENOUS
  Administered 2013-06-02: 125 mL via INTRAVENOUS
  Administered 2013-06-03: 125 mL/h via INTRAVENOUS
  Administered 2013-06-03 – 2013-06-06 (×6): via INTRAVENOUS
  Filled 2013-06-02 (×12): qty 1000

## 2013-06-02 NOTE — Plan of Care (Signed)
Problem: Phase I Progression Outcomes Goal: Voiding-avoid urinary catheter unless indicated Outcome: Progressing Foley out this AM

## 2013-06-02 NOTE — Progress Notes (Signed)
1 Day Post-Op  Subjective: She is doing okay, no complaints overnight  Objective: Vital signs in last 24 hours: Temp:  [97.4 F (36.3 C)-99.7 F (37.6 C)] 99.7 F (37.6 C) (10/11 0547) Pulse Rate:  [71-93] 93 (10/11 0547) Resp:  [11-22] 20 (10/11 0803) BP: (124-159)/(56-89) 135/65 mmHg (10/11 0547) SpO2:  [96 %-100 %] 96 % (10/11 0803) Weight:  [200 lb (90.719 kg)] 200 lb (90.719 kg) (10/10 1410) Last BM Date: 06/01/13  Intake/Output from previous day: 10/10 0701 - 10/11 0700 In: 3937.5 [I.V.:3937.5] Out: 1725 [Urine:1550; Emesis/NG output:125; Blood:50] Intake/Output this shift: Total I/O In: -  Out: 200 [Urine:200]  General appearance: alert, cooperative and no distress Resp: nonlabored Cardio: normal rate, regular GI: soft, moderate abdominal tenderness R>L, ND, incisional wound vac in place, NG with mostly clear output Extremities: SCD's bilat  Lab Results:   Recent Labs  06/02/13 0545  WBC 14.5*  HGB 10.1*  HCT 32.0*  PLT 308   BMET  Recent Labs  06/02/13 0545  NA 131*  K 4.0  CL 98  CO2 25  GLUCOSE 156*  BUN 5*  CREATININE 0.65  CALCIUM 8.9   PT/INR No results found for this basename: LABPROT, INR,  in the last 72 hours ABG No results found for this basename: PHART, PCO2, PO2, HCO3,  in the last 72 hours  Studies/Results: No results found.  Anti-infectives: Anti-infectives   Start     Dose/Rate Route Frequency Ordered Stop   06/02/13 1200  ertapenem (INVANZ) 1 g in sodium chloride 0.9 % 50 mL IVPB     1 g 100 mL/hr over 30 Minutes Intravenous  Once 06/01/13 1818     05/31/13 1530  ertapenem (INVANZ) 1 g in sodium chloride 0.9 % 50 mL IVPB     1 g 100 mL/hr over 30 Minutes Intravenous  Once 05/31/13 1520 06/01/13 1338      Assessment/Plan: s/p Procedure(s): SIGMOID COLECTOMY AND RIGHT  COLECTOMY (N/A) APPLICATION OF WOUND VAC (N/A) she looks okay for POD 1.  NG tube with mostly clear output but will leave today and if still clear  output and not too much output tomorrow with remove.  Foley out.  Mobilize today.  Pain control with PCA  LOS: 1 day    Lodema Pilot DAVID 06/02/2013

## 2013-06-02 NOTE — Op Note (Signed)
[name], [name]:  [id number]  MEDICAL RECORD NO.:  [id number]  LOCATION:  1527                         FACILITY:  [facility]  PHYSICIAN:  Lodema Pilot, MD       DATE OF BIRTH:  14-May-1951  DATE OF PROCEDURE:  06/01/2013 DATE OF DISCHARGE:                              OPERATIVE REPORT   PROCEDURE:  Sigmoid colectomy and ileocecectomy and placement of incisional wound VAC.  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  SURGEON:  Lodema Pilot, MD  ASSISTANT:  Dr. Magnus Ivan.  ANESTHESIA:  General endotracheal anesthesia.  FLUIDS:  2200 mL of crystalloid.  ESTIMATED BLOOD LOSS:  50 mL.  DRAINS:  Incisional wound VAC.  SPECIMEN: 1. Sigmoid colon with a suture marking the proximal margin. 2. Cecum and appendix and ileocecectomy.  COMPLICATIONS:  None apparent.  FINDINGS:  Large sigmoid colon mass with primary anastomosis.  Large colon polyps in the cecum.  No evidence of metastatic disease.  INDICATION FOR PROCEDURE:  Ms. Zinda is a 62 year old female who recently underwent colonoscopy and was found to have a large sigmoid colon cancer.  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  OPERATIVE DETAILS:  Ms. Summerhill was seen and evaluated in the preoperative area, and risks and benefits of the procedure were again discussed in lay terms.  Informed consent was obtained.  I discussed with her the options were two segmental colectomy versus subtotal colectomy, and she elected to proceed with two segmental colectomy and 2 anastomoses with preservation of the remainder of the colon if possible. She was given prophylactic antibiotics and given subcu heparin, taken to the operating room, placed on the table in supine position  with the legs in the Yellofin stirrups.  General endotracheal anesthesia was obtained and Foley catheter was placed, and her abdomen was prepped and draped in a standard surgical fashion.  Procedure time-out was performed with all operative team members to confirm proper patient, procedure, and a midline incision was made in the skin, and dissection carried down through the subcutaneous tissue using Bovie electrocautery.  The peritoneum was entered, and the fascia was divided along the lengthy incision.  I explored the abdomen, and there was no obvious metastatic disease.  She had no palpable liver masses.  She had a large mass that was palpable in the sigmoid colon, and I could also palpate the colon polyp in the cecum.  She had an actually a very redundant colon, both redundant transverse colon, as well as a redundant sigmoid colon and cecum.  With the mass identified, we decided to perform a sigmoid colectomy.  The white line of Toldt was mobilized laterally mobilizing the lateral attachments of the sigmoid colon and left colon, carried this up to the splenic flexure to gain additional length.  I medialized the colon and identified the ureter posteriorly in its usual anatomic position.  Because she had such redundant amount of colon in the sigmoid region, it was felt  that she had plenty of length for primary anastomosis.  I scored the mesentery medially and laterally in the anticipated area of resection.  I created a window through the mesocolon proximally about 5-7 cm or so proximal to the lesion and divided the colon with the 75 mm Endo-GIA stapler.  The mesentery was divided down to the vascular pedicle and the sigmoid vessels.  I took this down to what appeared to be the base of the sigmoid pedicle and then distally with another 5-7 cm distal to the mass created another window through the mesocolon and divided the colon with another firing of the GIA stapler.  I continued to divide  the mesentery down to the pedicle with the LigaSure device, and the pedicle was divided with a ligature and additional 2-0 silk stick tie was placed for added security.  I mobilized a little bit more of the proximal colon up towards the splenic flexure, and she had plenty of laxity in order to perform a primary anastomosis.  There was a left intra-abdominal rectum in order to perform a side-to-side stapled anastomosis.  Small colotomies were made on the antimesenteric surface of the colon, and side-to-side anastomosis was created with a 75 mm x 3.5 mm staple load.  The common enterotomy was identified and closed with a TA 90 stapler resecting any of the prior staple lines with the closure.  We over sewed the staple line with a 2-0 silk Lembert sutures, and a crotch stitch was placed as well.  The mesenteric defect was actually pretty well closed out as the colon appeared to lie fairly flat along the psoas muscles, and we felt that this did not need any additional closure.  We proceeded with the mobilization of the right colon, and the cecum was actually fairly mobilized up the appendix, and the polyp was palpable and the cap of the cecum, and we felt that we could perform cecectomy for this polyp leaving the ileocecal valve intact and avoiding another anastomosis.  A TA 90 stapler was used to transect the cap of the cecum with the appendix, and this was opened on the back table.  However, the staple lines appeared to be close to the base of the polyp, and I could not be entirely certain that the entire polyp was removed, so at this time, we decided to continue with the original plans and perform ileocecectomy to ensure that we had complete resection of this polyp.  I mobilized up the cecum and right colon from the lateral attachments and divided the terminal ileum about 10 cm proximal to the ileocecal valve with a GIA stapler.  I also divided the ascending colon proximal to the right  colic vessels and divided the mesentery with the LigaSure device down to the base of the ileocolic vessels.  The mesentery was divided with the ligature device, and the specimen was completely removed and sent to Pathology.  Then, a small enterotomy and a small colotomy were made on the antimesenteric surface of each of the small bowel and the colon, and a second side-to-side stapled end anastomosis was created between the terminal ileum and the right colon.  The common enterotomy was again closed with a single firing of the TA 90 stapler removing the prior staple lines, and this staple line was oversewn again with 2-0 Lembert silk sutures.  A crotch stitch was placed, and the anastomosis was felt to be widely patent.  This mesenteric defect was closed with a 2-0 silk running suture.  The abdomen was irrigated with sterile saline solution. An NGT was placed and confirmed to be in the proper position, and we irrigated the abdomen, and the fascia was closed.  Prior to closing the fascia,  I ran the small bowel from the ligament of Treitz to the ileocecal valve or through the anastomosis, and there was no evidence of bowel injury.  The fascia was then closed with #1 loop PDS x2, and prior to the fascia closure we had changed all the entrance for closure and gloves and gowns per hospital protocol.  The wound was irrigated and noted to be hemostatic, and the skin edges were approximated with skin staples.  An incisional wound VAC was placed, and the Foley catheter was left in place.  All sponge, needle, and instrument counts correct at the end of the case, and the patient tolerated the procedure well and was stable, and ready for transfer to the recovery room in stable condition.          ______________________________ Lodema Pilot, MD     BL/MEDQ  D:  06/01/2013  T:  06/02/2013  Job:  [id number]

## 2013-06-03 ENCOUNTER — Encounter (HOSPITAL_COMMUNITY): Payer: Self-pay | Admitting: Anesthesiology

## 2013-06-03 LAB — CBC
HCT: 28.4 % — ABNORMAL LOW (ref 36.0–46.0)
Hemoglobin: 9.2 g/dL — ABNORMAL LOW (ref 12.0–15.0)
MCV: 79.6 fL (ref 78.0–100.0)
RBC: 3.57 MIL/uL — ABNORMAL LOW (ref 3.87–5.11)
WBC: 11.4 10*3/uL — ABNORMAL HIGH (ref 4.0–10.5)

## 2013-06-03 LAB — BASIC METABOLIC PANEL
CO2: 27 mEq/L (ref 19–32)
Chloride: 102 mEq/L (ref 96–112)
GFR calc Af Amer: >90 mL/min (ref >90)
Potassium: 3.5 mEq/L (ref 3.5–5.1)
Sodium: 134 mEq/L — ABNORMAL LOW (ref 135–145)

## 2013-06-03 NOTE — Progress Notes (Signed)
2 Days Post-Op  Subjective: Pain improved.  No flatus or BM  Objective: Vital signs in last 24 hours: Temp:  [98.6 F (37 C)-99.6 F (37.6 C)] 99.6 F (37.6 C) (10/12 0558) Pulse Rate:  [86-98] 98 (10/12 0558) Resp:  [12-20] 16 (10/12 0558) BP: (127-132)/(59-72) 131/65 mmHg (10/12 0558) SpO2:  [93 %-100 %] 99 % (10/12 0558) Last BM Date: 06/01/13  Intake/Output from previous day: 10/11 0701 - 10/12 0700 In: 2439.6 [I.V.:2439.6] Out: 2765 [Urine:2315; Emesis/NG output:450] Intake/Output this shift:    General appearance: alert, cooperative and no distress Resp: clear to auscultation bilaterally Cardio: normal rate, regular GI: soft, tenderness improved, ND, wound vac in place, no peritoneal signs Extremities: SCD's bilar  Lab Results:   Recent Labs  06/02/13 0545 06/03/13 0450  WBC 14.5* 11.4*  HGB 10.1* 9.2*  HCT 32.0* 28.4*  PLT 308 298   BMET  Recent Labs  06/02/13 0545 06/03/13 0450  NA 131* 134*  K 4.0 3.5  CL 98 102  CO2 25 27  GLUCOSE 156* 127*  BUN 5* 3*  CREATININE 0.65 0.76  CALCIUM 8.9 8.5   PT/INR No results found for this basename: LABPROT, INR,  in the last 72 hours ABG No results found for this basename: PHART, PCO2, PO2, HCO3,  in the last 72 hours  Studies/Results: No results found.  Anti-infectives: Anti-infectives   Start     Dose/Rate Route Frequency Ordered Stop   06/02/13 1200  ertapenem (INVANZ) 1 g in sodium chloride 0.9 % 50 mL IVPB     1 g 100 mL/hr over 30 Minutes Intravenous  Once 06/01/13 1818 06/02/13 1227   05/31/13 1530  ertapenem (INVANZ) 1 g in sodium chloride 0.9 % 50 mL IVPB     1 g 100 mL/hr over 30 Minutes Intravenous  Once 05/31/13 1520 06/01/13 1338      Assessment/Plan: s/p Procedure(s): SIGMOID COLECTOMY AND RIGHT  COLECTOMY (N/A) APPLICATION OF WOUND VAC (N/A) she is feeling better today.  less pain.  still no bowel function.  Not much NG output and mostly clear.  will clamp NG and remove later  today if no nausea or vomiting.  hopefully able to start diet in AM  LOS: 2 days    Lodema Pilot DAVID 06/03/2013

## 2013-06-03 NOTE — Progress Notes (Signed)
Ng tube clamped since am. Denies nausea and denies increased abdominal pain. Denies flatus.  No vomiting. abd soft. 130 cc residual aspirated from ng tube. Dr Biagio Quint notified . Ng removed per order.

## 2013-06-04 ENCOUNTER — Encounter (HOSPITAL_COMMUNITY): Payer: Self-pay | Admitting: General Surgery

## 2013-06-04 MED ORDER — OXYCODONE HCL 5 MG PO TABS
5.0000 mg | ORAL_TABLET | ORAL | Status: DC | PRN
  Administered 2013-06-04 (×2): 5 mg via ORAL
  Administered 2013-06-05 (×2): 10 mg via ORAL
  Administered 2013-06-06: 5 mg via ORAL
  Filled 2013-06-04: qty 1
  Filled 2013-06-04: qty 2
  Filled 2013-06-04 (×2): qty 1
  Filled 2013-06-04: qty 2

## 2013-06-04 MED ORDER — ACETAMINOPHEN 325 MG PO TABS
650.0000 mg | ORAL_TABLET | ORAL | Status: DC | PRN
Start: 2013-06-04 — End: 2013-06-07
  Administered 2013-06-04 – 2013-06-06 (×2): 650 mg via ORAL
  Filled 2013-06-04 (×2): qty 2

## 2013-06-04 MED ORDER — MORPHINE SULFATE 2 MG/ML IJ SOLN: 1.0000 mg | INTRAMUSCULAR | Status: DC | PRN

## 2013-06-04 NOTE — Progress Notes (Signed)
3 Days Post-Op  Subjective: Pain continues to improve, no flatus or BM, no nausea with NG out   Objective: Vital signs in last 24 hours: Temp:  [98 F (36.7 C)-99.4 F (37.4 C)] 99.1 F (37.3 C) (10/13 0545) Pulse Rate:  [92-111] 92 (10/13 0545) Resp:  [12-17] 12 (10/13 0756) BP: (118-146)/(77-83) 126/80 mmHg (10/13 0545) SpO2:  [92 %-100 %] 99 % (10/13 0756) Last BM Date: 06/01/13  Intake/Output from previous day: 10/12 0701 - 10/13 0700 In: 2750 [I.V.:2750] Out: 1150 [Urine:1150] Intake/Output this shift: Total I/O In: 500 [I.V.:500] Out: -   General appearance: alert, cooperative and no distress Resp: clear to auscultation bilaterally Cardio: normal rate, regular GI: soft, mild incisional tenderness, ND, wound vac in place   Lab Results:   Recent Labs  06/02/13 0545 06/03/13 0450  WBC 14.5* 11.4*  HGB 10.1* 9.2*  HCT 32.0* 28.4*  PLT 308 298   BMET  Recent Labs  06/02/13 0545 06/03/13 0450  NA 131* 134*  K 4.0 3.5  CL 98 102  CO2 25 27  GLUCOSE 156* 127*  BUN 5* 3*  CREATININE 0.65 0.76  CALCIUM 8.9 8.5   PT/INR No results found for this basename: LABPROT, INR,  in the last 72 hours ABG No results found for this basename: PHART, PCO2, PO2, HCO3,  in the last 72 hours  Studies/Results: No results found.  Anti-infectives: Anti-infectives   Start     Dose/Rate Route Frequency Ordered Stop   06/02/13 1200  ertapenem (INVANZ) 1 g in sodium chloride 0.9 % 50 mL IVPB     1 g 100 mL/hr over 30 Minutes Intravenous  Once 06/01/13 1818 06/02/13 1227   05/31/13 1530  ertapenem (INVANZ) 1 g in sodium chloride 0.9 % 50 mL IVPB     1 g 100 mL/hr over 30 Minutes Intravenous  Once 05/31/13 1520 06/01/13 1338      Assessment/Plan: s/p Procedure(s): SIGMOID COLECTOMY AND RIGHT  COLECTOMY (N/A) APPLICATION OF WOUND VAC (N/A) she seems to be progressing well.  will try sips of clears.  change to oral pain meds.  LOS: 3 days    Lodema Pilot  DAVID 06/04/2013

## 2013-06-05 LAB — CBC
HCT: 25.3 % — ABNORMAL LOW (ref 36.0–46.0)
Hemoglobin: 8.4 g/dL — ABNORMAL LOW (ref 12.0–15.0)
MCH: 26.3 pg (ref 26.0–34.0)
MCHC: 33.2 g/dL (ref 30.0–36.0)
MCV: 79.1 fL (ref 78.0–100.0)
Platelets: 288 10*3/uL (ref 150–400)
RBC: 3.2 MIL/uL — ABNORMAL LOW (ref 3.87–5.11)

## 2013-06-05 NOTE — Progress Notes (Signed)
4 Days Post-Op  Subjective: Tolerating clears. No flatus or BM  Objective: Vital signs in last 24 hours: Temp:  [98.9 F (37.2 C)-100.7 F (38.2 C)] 98.9 F (37.2 C) (10/14 0528) Pulse Rate:  [92-98] 96 (10/14 0528) Resp:  [12-18] 18 (10/14 0528) BP: (129-146)/(67-78) 146/73 mmHg (10/14 0528) SpO2:  [98 %-100 %] 100 % (10/14 0528) Last BM Date: 05/24/13  Intake/Output from previous day: 10/13 0701 - 10/14 0700 In: 2516.3 [I.V.:2516.3] Out: 1125 [Urine:1125] Intake/Output this shift:    General appearance: alert, cooperative and no distress Resp: clear to auscultation bilaterally Cardio: regular rate and rhythm, S1, S2 normal, no murmur, click, rub or gallop GI: soft, midline tenderness, wound vac in place, ND, no peritoneal signs, good bowel sounds Extremities: SCD's bilat  Lab Results:   Recent Labs  06/03/13 0450 06/05/13 0516  WBC 11.4* 7.9  HGB 9.2* 8.4*  HCT 28.4* 25.3*  PLT 298 288   BMET  Recent Labs  06/03/13 0450  NA 134*  K 3.5  CL 102  CO2 27  GLUCOSE 127*  BUN 3*  CREATININE 0.76  CALCIUM 8.5   PT/INR No results found for this basename: LABPROT, INR,  in the last 72 hours ABG No results found for this basename: PHART, PCO2, PO2, HCO3,  in the last 72 hours  Studies/Results: No results found.  Anti-infectives: Anti-infectives   Start     Dose/Rate Route Frequency Ordered Stop   06/02/13 1200  ertapenem (INVANZ) 1 g in sodium chloride 0.9 % 50 mL IVPB     1 g 100 mL/hr over 30 Minutes Intravenous  Once 06/01/13 1818 06/02/13 1227   05/31/13 1530  ertapenem (INVANZ) 1 g in sodium chloride 0.9 % 50 mL IVPB     1 g 100 mL/hr over 30 Minutes Intravenous  Once 05/31/13 1520 06/01/13 1338      Assessment/Plan: s/p Procedure(s): SIGMOID COLECTOMY AND RIGHT  COLECTOMY (N/A) APPLICATION OF WOUND VAC (N/A) will keep on clears until return of bowel function.  mobilize.    LOS: 4 days    Lodema Pilot DAVID 06/05/2013

## 2013-06-06 NOTE — Progress Notes (Signed)
5 Days Post-Op  Subjective: Still no flatus or BM but tolerating liquids well for 2 days.  Path reviewed with the patient.  Objective: Vital signs in last 24 hours: Temp:  [99.4 F (37.4 C)-100 F (37.8 C)] 99.4 F (37.4 C) (10/15 0625) Pulse Rate:  [87-91] 87 (10/15 0625) Resp:  [18] 18 (10/15 0625) BP: (126-150)/(74-82) 134/82 mmHg (10/15 0625) SpO2:  [97 %-100 %] 97 % (10/15 0625) Last BM Date: 05/24/13  Intake/Output from previous day: 10/14 0701 - 10/15 0700 In: 3120 [P.O.:720; I.V.:2400] Out: 3150 [Urine:3150] Intake/Output this shift:    General appearance: alert, cooperative and no distress Resp: nonlabored GI: soft, no significant tenderness, ND, wound vac removed and incision looks good, no signs of infection. Extremities: SCD's bilat  Lab Results:   Recent Labs  06/05/13 0516  WBC 7.9  HGB 8.4*  HCT 25.3*  PLT 288   BMET No results found for this basename: NA, K, CL, CO2, GLUCOSE, BUN, CREATININE, CALCIUM,  in the last 72 hours PT/INR No results found for this basename: LABPROT, INR,  in the last 72 hours ABG No results found for this basename: PHART, PCO2, PO2, HCO3,  in the last 72 hours  Studies/Results: No results found.  Anti-infectives: Anti-infectives   Start     Dose/Rate Route Frequency Ordered Stop   06/02/13 1200  ertapenem (INVANZ) 1 g in sodium chloride 0.9 % 50 mL IVPB     1 g 100 mL/hr over 30 Minutes Intravenous  Once 06/01/13 1818 06/02/13 1227   05/31/13 1530  ertapenem (INVANZ) 1 g in sodium chloride 0.9 % 50 mL IVPB     1 g 100 mL/hr over 30 Minutes Intravenous  Once 05/31/13 1520 06/01/13 1338      Assessment/Plan: s/p Procedure(s): SIGMOID COLECTOMY AND RIGHT  COLECTOMY (N/A) APPLICATION OF WOUND VAC (N/A) She looks good today.  Tolerating clears and no evidence of postop complications. Still no flatus or BM but will try advancing diet as tolerated.  Continue to ambulate.  Path reviewed with the patient.   LOS: 5 days     Lodema Pilot DAVID 06/06/2013

## 2013-06-07 ENCOUNTER — Encounter (INDEPENDENT_AMBULATORY_CARE_PROVIDER_SITE_OTHER): Payer: Self-pay | Admitting: General Surgery

## 2013-06-07 ENCOUNTER — Other Ambulatory Visit (INDEPENDENT_AMBULATORY_CARE_PROVIDER_SITE_OTHER): Payer: Self-pay | Admitting: General Surgery

## 2013-06-07 MED ORDER — OXYCODONE HCL 5 MG PO TABS: 5.0000 mg | ORAL_TABLET | ORAL | Status: DC | PRN

## 2013-06-07 NOTE — Progress Notes (Signed)
6 Days Post-Op  Subjective: Tolerating regular diet.  No flatus or BM  Objective: Vital signs in last 24 hours: Temp:  [99 F (37.2 C)-100.2 F (37.9 C)] 99.4 F (37.4 C) (10/16 0610) Pulse Rate:  [80-92] 85 (10/16 0610) Resp:  [20] 20 (10/16 0610) BP: (122-138)/(77-86) 138/83 mmHg (10/16 0610) SpO2:  [96 %-100 %] 97 % (10/16 0610) Last BM Date: 05/31/13  Intake/Output from previous day: 10/15 0701 - 10/16 0700 In: 1708.3 [P.O.:1440; I.V.:268.3] Out: 3850 [Urine:3850] Intake/Output this shift:    General appearance: alert, cooperative and no distress Resp: clear to auscultation bilaterally Cardio: regular rate and rhythm, S1, S2 normal, no murmur, click, rub or gallop GI: soft, non-tender; bowel sounds normal; no masses,  no organomegaly and active BS, wound looks good  Lab Results:   Recent Labs  06/05/13 0516  WBC 7.9  HGB 8.4*  HCT 25.3*  PLT 288   BMET No results found for this basename: NA, K, CL, CO2, GLUCOSE, BUN, CREATININE, CALCIUM,  in the last 72 hours PT/INR No results found for this basename: LABPROT, INR,  in the last 72 hours ABG No results found for this basename: PHART, PCO2, PO2, HCO3,  in the last 72 hours  Studies/Results: No results found.  Anti-infectives: Anti-infectives   Start     Dose/Rate Route Frequency Ordered Stop   06/02/13 1200  ertapenem (INVANZ) 1 g in sodium chloride 0.9 % 50 mL IVPB     1 g 100 mL/hr over 30 Minutes Intravenous  Once 06/01/13 1818 06/02/13 1227   05/31/13 1530  ertapenem (INVANZ) 1 g in sodium chloride 0.9 % 50 mL IVPB     1 g 100 mL/hr over 30 Minutes Intravenous  Once 05/31/13 1520 06/01/13 1338      Assessment/Plan: s/p Procedure(s): SIGMOID COLECTOMY AND RIGHT  COLECTOMY (N/A) APPLICATION OF WOUND VAC (N/A) tolerating diet and vitals normal.  no pain.  she looks good and feels well.  awaiting return of bowel function prior to discharge.  she may be okay for discharge later today or tomorrow.  LOS: 6 days    Laurie Duarte 06/07/2013

## 2013-06-07 NOTE — Care Management Note (Signed)
    Page 1 of 1   06/07/2013     3:04:35 PM   CARE MANAGEMENT NOTE 06/07/2013  Patient:  Laurie Duarte,Laurie Duarte   Account Number:  [id number]  Date Initiated:  06/02/2013  Documentation initiated by:  Algernon Huxley  Subjective/Objective Assessment:   62 year old female admitted s/p right colectomy and sigmoid colectomy.     Action/Plan:   From home.   Anticipated DC Date:  06/08/2013   Anticipated DC Plan:  HOME W HOME HEALTH SERVICES      DC Planning Services  CM consult      Choice offered to / List presented to:             Status of service:  Completed, signed off Medicare Important Message given?  NA - LOS <3 / Initial given by admissions (If response is "NO", the following Medicare IM given date fields will be blank) Date Medicare IM given:   Date Additional Medicare IM given:    Discharge Disposition:  HOME/SELF CARE  Per UR Regulation:  Reviewed for med. necessity/level of care/duration of stay  If discussed at Long Length of Stay Meetings, dates discussed:    Comments:

## 2013-06-07 NOTE — Progress Notes (Signed)
Had bowel movement.  Still feeling well. She would like to go home now.

## 2013-06-11 ENCOUNTER — Other Ambulatory Visit (INDEPENDENT_AMBULATORY_CARE_PROVIDER_SITE_OTHER): Payer: Self-pay

## 2013-06-11 ENCOUNTER — Encounter (INDEPENDENT_AMBULATORY_CARE_PROVIDER_SITE_OTHER): Payer: Self-pay | Admitting: Surgery

## 2013-06-11 ENCOUNTER — Ambulatory Visit (INDEPENDENT_AMBULATORY_CARE_PROVIDER_SITE_OTHER): Payer: BC Managed Care – PPO | Admitting: Surgery

## 2013-06-11 VITALS — BP 130/78 | HR 84 | Temp 98.6°F | Resp 15 | Ht 65.0 in | Wt 191.4 lb

## 2013-06-11 DIAGNOSIS — T814XXS Infection following a procedure, sequela: Secondary | ICD-10-CM

## 2013-06-11 DIAGNOSIS — C187 Malignant neoplasm of sigmoid colon: Secondary | ICD-10-CM

## 2013-06-11 NOTE — Progress Notes (Signed)
[facility]  Laurie Kin, MD,  FACS [address]  [address] [place], [place]    [address] Phone:  [phone] FAX:  [phone]   Re:   Laurie Duarte DOB:   May 24, 1951 MRN:   [id number]  Urgent Office  ASSESSMENT AND PLAN: 1.  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  Drainage from two areas of the wound  One area immediately above the umbilicus and one in lower third of wound.  It is unclear to me whether this is fat necrosis vs seroma vs wound infection.  I cultured wound, removed staples, and will start local wound care.  At this time, I do not think that she needs antibiotics.  She will change dressings 2 to 3 times per day.  She can shower.  She'll see Laurie Duarte 06/14/2013.  HISTORY OF PRESENT ILLNESS: Chief Complaint  Patient presents with  . Routine Post Op    reck surgery site/   Laurie Duarte is a 62 y.o. (DOB: 1951/04/03)  AA  female who is a patient of Laurie Duarte and comes to Urgent Office today for drainage from her abdominal wound.  She noticed this Friday, 10/17.  She has had no fever.  Her appetite is poor but getting better.  Her last BM was Friday, 10/17, but she is passing flatus. Her daughter, Laurie Duarte, is with her.  Past Medical History  Diagnosis Date  . Benign heart murmur   . Anemia   . Cancer     colon  . Hyperlipidemia   . 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 Her daughter, Laurie Duarte, is with her.  PHYSICAL EXAM: BP 130/78  Pulse 84  Temp(Src) 98.6 F (37 C) (Temporal)  Resp 15  Ht 5\' 5"  (1.651 m)  Wt 191 lb 6.4 oz (86.818 kg)  BMI 31.85 kg/m2  Lungs:  Clear Abdomen:  BS present.  Mild redness of wound.  Soupy drainage from two areas of the wound.  I took staples out above the umbilicus for about 4 cm and about 3 cm in the lower part of the wound.  I obtained  cultures.  I cleaned it with H2O2 and packed it with saline gauze.  DATA REVIEWED: Epic notes.  Laurie Kin, MD,  [facility] [address] [place]  [address]   [place], [place]    [address] Phone:  [phone] FAX:  [phone]

## 2013-06-14 ENCOUNTER — Encounter (INDEPENDENT_AMBULATORY_CARE_PROVIDER_SITE_OTHER): Payer: Self-pay | Admitting: General Surgery

## 2013-06-14 ENCOUNTER — Ambulatory Visit (INDEPENDENT_AMBULATORY_CARE_PROVIDER_SITE_OTHER): Payer: BC Managed Care – PPO | Admitting: General Surgery

## 2013-06-14 VITALS — BP 130/80 | HR 88 | Temp 98.7°F | Resp 15 | Ht 65.0 in | Wt 191.2 lb

## 2013-06-14 DIAGNOSIS — Z4889 Encounter for other specified surgical aftercare: Secondary | ICD-10-CM

## 2013-06-14 DIAGNOSIS — C189 Malignant neoplasm of colon, unspecified: Secondary | ICD-10-CM

## 2013-06-14 DIAGNOSIS — Z5189 Encounter for other specified aftercare: Secondary | ICD-10-CM

## 2013-06-14 NOTE — Discharge Summary (Signed)
Physician Discharge Summary  Patient ID: Laurie Duarte MRN: [id number] DOB/AGE: [birth year] 62 y.o.  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  She did well postop but was slow to show bowel function.  Her diet was advanced and eventually tolerating regular diet.  Pain was well controlled and incisional wound vac was removed on POD 5.  She continued to do well and eventually bowel function returned. She was stable for discharge on POD 6  Consults: None  Significant Diagnostic Studies: none  Treatments: surgery: 06/01/13 open sigmoidectomy and ileocectomy  Disposition: 01-Home or Self Care  Discharge Orders   Future Appointments Provider Department Dept Phone   06/14/2013 1:15 PM Lodema Pilot, DO [facility], [place] [phone]   Future Orders Complete By Expires   Call MD for:  difficulty breathing, headache or visual disturbances  As directed    Call MD for:  persistant dizziness or light-headedness  As directed    Call MD for:  persistant nausea and vomiting  As directed    Call MD for:  redness, tenderness, or signs of infection (pain, swelling, redness, odor or green/yellow discharge around incision site)  As directed    Call MD for:  severe uncontrolled pain  As directed    Call MD for:  temperature >100.4  As directed    Diet - low sodium heart healthy  As directed    Discharge instructions  As directed    Comments:     Call [phone] to schedule appointment with Dr Biagio Quint next week (preferrably next Friday) for staple removal Gradually increase activity and diet as tolerated.   May shower.   Increase activity slowly  As directed         Medication List         ACAI PO  Take 1,000 mg by mouth 2 (two) times daily.     ACIDOPHOLUS PO  Take 1 tablet by mouth daily.     calcium-vitamin D 500-200 MG-UNIT per tablet  Commonly known as:  OSCAL WITH D  Take 1 tablet by mouth.     COD LIVER OIL PO  Take 2-3 tablets by mouth daily.     multivitamin with minerals Tabs tablet  Take 1 tablet by mouth daily.     oxyCODONE 5 MG immediate release tablet  Commonly known as:  Oxy IR/ROXICODONE  Take 5 mg by mouth every 4 (four) hours as needed for pain.         SignedLodema Pilot DAVID 06/14/2013, 12:52 PM

## 2013-06-14 NOTE — Progress Notes (Signed)
Subjective:     Patient ID: Laurie Duarte, female   DOB: Jul 04, 1951, 62 y.o.   MRN: [id number]  HPI Follow up 2 weeks s/p open sigmoid colectomy and ileocecectomy. Her pathology was benign and was a T3 N0 tumor.  She is moving her bowels well about 2 times per day where previously she was about once per day. She denies any blood in the stools. She is tolerating regular diet and is off pain medication. She denies any fevers or chills or abdominal pain. She was seen here a few days ago with some drainage from her wound although she did not have any pain or fevers there was concern for possible wound infection and part of her incision was opened up. Cultures were negative for infection and she has been packing the wound herself.  Review of Systems     Objective:   Physical Exam Her incision is healing fine. I left the staples in place for another week and I repacked her wound today wet-to-dry. She is healing nicely with healthy granulation tissue.    Assessment:     Status post sigmoid colectomy History of colon cancer She had a T3 N0 colon cancer and she seems to be recovering okay from her procedure. She has a partially open wound which is healing nicely and it is still unclear judging from the notes whether this was truly infected or a sterile drainage. Regardless she is healing nicely no and doing well. She has no complaints. Her bowels are functioning normally. I offered to send her to meet with the medical oncologist to discuss adjuvant therapies although I do not think that she will require any additional chemotherapy. I will see her back in about one week for staple removal and wound check. Continue with her twice daily wound packing     Plan:     Continue wound packing Follow up in one week for a wound evaluation and staple removal Medical oncology referral to discuss adjuvant therapies---- I recommended followup colonoscopy and CEA level in one year

## 2013-06-15 LAB — WOUND CULTURE: Gram Stain: NONE SEEN

## 2013-06-18 ENCOUNTER — Encounter (INDEPENDENT_AMBULATORY_CARE_PROVIDER_SITE_OTHER): Payer: Self-pay

## 2013-06-19 ENCOUNTER — Telehealth: Payer: Self-pay | Admitting: Oncology

## 2013-06-19 NOTE — Telephone Encounter (Signed)
Pt called in re to NP appt gve 11/06 @ 1:30 w/Dr. Truett Perna Welcome packet mailed.

## 2013-06-21 ENCOUNTER — Ambulatory Visit (INDEPENDENT_AMBULATORY_CARE_PROVIDER_SITE_OTHER): Payer: BC Managed Care – PPO | Admitting: General Surgery

## 2013-06-21 ENCOUNTER — Encounter (INDEPENDENT_AMBULATORY_CARE_PROVIDER_SITE_OTHER): Payer: Self-pay

## 2013-06-21 ENCOUNTER — Telehealth: Payer: Self-pay | Admitting: *Deleted

## 2013-06-21 VITALS — BP 120/76 | HR 80 | Temp 97.8°F | Resp 16 | Ht 65.0 in | Wt 189.4 lb

## 2013-06-21 DIAGNOSIS — Z4802 Encounter for removal of sutures: Secondary | ICD-10-CM

## 2013-06-21 NOTE — Progress Notes (Signed)
Patient came in s/p colectomy for staple removal and repacking of wound.  After removing the dressings and packing, the wound had good beefy red granulation tissue with no redness, drainage, or odor.  The patient has no c/o fever, N/v, or chills.  Her skin is getting mildly irritated from the tape so we switched to paper tape with minimal use.  I cleansed the skin with chlorhexidine and removed that staples from the midline incision.  I then applied benzoin and applied 1/2" steri-strips to the incision.  I repacked the wounds with sterile wet to dry 2x2 and 4x4 gauze.  I then placed an ABD pad over the entire incision.  Explained to the patient that the steri-strips will fall of on their own in 2-3 weeks.  Informed her she could continue showering TID and just let the warm soapy water run over the incisions.  The patient tolerated this procedure well and has a f/u appt with Dr. Biagio Quint on 06/29/13 at 8:45.

## 2013-06-21 NOTE — Telephone Encounter (Signed)
Spoke with patient by phone and confirmed appointment with Dr. Truett Perna for 06/28/13.  Contact names and phone numbers were provided.

## 2013-06-28 ENCOUNTER — Ambulatory Visit (HOSPITAL_BASED_OUTPATIENT_CLINIC_OR_DEPARTMENT_OTHER): Payer: BC Managed Care – PPO | Admitting: Oncology

## 2013-06-28 ENCOUNTER — Encounter: Payer: Self-pay | Admitting: Oncology

## 2013-06-28 ENCOUNTER — Ambulatory Visit: Payer: BC Managed Care – PPO

## 2013-06-28 ENCOUNTER — Other Ambulatory Visit: Payer: Self-pay

## 2013-06-28 VITALS — BP 140/65 | HR 85 | Temp 99.1°F | Resp 18 | Ht 65.0 in | Wt 189.4 lb

## 2013-06-28 DIAGNOSIS — Z86010 Personal history of colonic polyps: Secondary | ICD-10-CM

## 2013-06-28 DIAGNOSIS — C187 Malignant neoplasm of sigmoid colon: Secondary | ICD-10-CM

## 2013-06-28 NOTE — Progress Notes (Signed)
[facility] New Patient Consult   Referring MD: Cera Rorke 62 y.o.  1951/08/17    Reason for Referral: Colon cancer     HPI: She reports abdominal pain and thin bowel movements beginning in approximately April of this year. She also had abdominal pain that was relieved with drinking soda. She was referred to Dr. Loreta Ave and was taken to a colonoscopy on 05/07/2013. 3 polyps were removed from the rectum. 2 small sessile polyps were found at the rectosigmoid colon and were removed. A large ulcerated mass was found in the sigmoid colon at 42-52 cm. The mass was biopsied. A sessile polyp was found in the a sending colon. Biopsies were taken. A polypoid nonobstructing mass was found at the cecum along with a small polyp. This was biopsied. The terminal ileum appeared normal. The pathology revealed a tubular adenoma and hyperplastic polyps of the rectum with no high-grade dysplasia or malignancy. The biopsy from the cecum revealed fragments of a tubulovillous adenoma. The mid ascending colon polyp was a tubulovillous adenoma without high-grade dysplasia or malignancy. The mass at the descending colon returned as invasive adenocarcinoma.  She was referred for CTs of the abdomen and pelvis on 05/07/2013. The lung bases appear clear. An incidental subcapsular hypodense lesion measured 14 mm, suspected to represent a subcapsular cyst. No other hepatic abnormality. No abdominal adenopathy. A short segment of circumferential thickening was noted in the sigmoid colon and small 5 mm adjacent lymph nodes. Hypodense lobulated mass in the cecum.  She was referred to Dr. Biagio Quint and was taken to the operating room for a sigmoid colectomy and ileocecectomy on 06/01/2013. A large sigmoid colon mass and large polyp in the cecum were noted. No evidence of metastatic disease.  The pathology ([id number]) revealed an invasive well-differentiated adenocarcinoma of the sigmoid colon. The  resection margins were negative. 15 lymph nodes were negative. No macroscopic tumor perforation. No lymphovascular or perineural invasion. Tumor invaded through the muscularis propria into the pericolonic tissue. The cecum resection revealed 2 tubulovillous adenomas without high-grade dysplasia or malignancy. The appendix appeared benign. 12 benign lymph nodes.  The colon tumor return microsatellite stable with no loss of expression of mismatch repair proteins.  The abdominal wound continues to heal. She is currently applying wet/dry dressings.  Past Medical History  Diagnosis Date  . Benign heart murmur   . Anemia   . Cancer  06/01/2013     colon-sigmoid, stage II (T3 N0) C.   . Hyperlipidemia   . Psoriasis     .   G4 P4  Past Surgical History  Procedure Laterality Date  . Tubal ligation    . Polyp removal   20 years ago     nasal cavities  . Incision and drainage intra oral abscess  1 2014  . Partial colectomy N/A 06/01/2013    Procedure: SIGMOID COLECTOMY AND RIGHT  COLECTOMY;  Surgeon: Lodema Pilot, DO;  Location: [facility];  Service: General;  Laterality: N/A;  . Application of wound vac N/A 06/01/2013    Procedure: APPLICATION OF WOUND VAC;  Surgeon: Lodema Pilot, DO;  Location: [facility];  Service: General;  Laterality: N/A;    Family History  Problem Relation Age of Onset  . Diabetes Mother   . Hypertension Mother   . Heart disease Mother   . Heart disease Father   . Asthma Father   . Diabetes Maternal Grandmother   . Heart disease Brother   . Lupus Daughter   .  Mental illness Daughter     ptsd  A paternal uncle had colon cancer. Her father had prostate cancer. A paternal uncle had "bone "cancer. A paternal cousin had "bone" cancer. A maternal aunt had breast cancer. No other family history of cancer.  Current outpatient prescriptions:ACAI PO, Take 1,000 mg by mouth 2 (two) times daily. , Disp: , Rfl: ;  calcium-vitamin D (OSCAL WITH D) 500-200 MG-UNIT per tablet, Take 1  tablet by mouth., Disp: , Rfl: ;  COD LIVER OIL PO, Take 2-3 tablets by mouth daily. , Disp: , Rfl: ;  Lactobacillus (ACIDOPHOLUS PO), Take 1 tablet by mouth daily. , Disp: , Rfl:  Multiple Vitamin (MULTIVITAMIN WITH MINERALS) TABS tablet, Take 1 tablet by mouth daily., Disp: , Rfl: ;  oxyCODONE (OXY IR/ROXICODONE) 5 MG immediate release tablet, Take 5 mg by mouth every 4 (four) hours as needed for pain., Disp: , Rfl:   Allergies: No Known Allergies  Social History: She is a Engineer, civil (consulting). [place] She works as a Building services engineer. She does not use tobacco. She reports social alcohol use. No transfusion history. No risk factor for HIV or hepatitis.  ROS:   Positives include: Intermittent night sweats and hot flashes since she was in her 20s, "" stool prior to surgery, healing abdominal wound  A complete ROS was otherwise negative.  Physical Exam:  Blood pressure 140/65, pulse 85, temperature 99.1 F (37.3 C), temperature source Oral, resp. rate 18, height 5\' 5"  (1.651 m), weight 189 lb 6.4 oz (85.911 kg).  HEENT: Oropharynx without visible mass, neck without mass Lungs: Clear bilaterally Cardiac: Regular rate and rhythm Abdomen: No hepatosplenomegaly, no mass, healing midline incision with Steri-Strips in place, and a few areas were the wound has superficial  opening Vascular: No leg edema Lymph nodes: No cervical, supraclavicular, axillary, or inguinal nodes Neurologic: Alert and oriented, the motor exam appears intact in the upper and lower extremities Skin: No rash Musculoskeletal: No spine tenderness   LAB:  CBC  Lab Results  Component Value Date   WBC 7.9 06/05/2013   HGB 8.4* 06/05/2013   HCT 25.3* 06/05/2013   MCV 79.1 06/05/2013   PLT 288 06/05/2013    Ferritin on 02/27/2013-11 CMP      Component Value Date/Time   NA 134* 06/03/2013 0450   K 3.5 06/03/2013 0450   CL 102 06/03/2013 0450   CO2 27 06/03/2013 0450   GLUCOSE 127* 06/03/2013 0450   BUN 3*  06/03/2013 0450   CREATININE 0.76 06/03/2013 0450   CREATININE 0.70 05/07/2013 1256   CALCIUM 8.5 06/03/2013 0450   PROT 6.7 12/04/2012 0925   ALBUMIN 4.1 12/04/2012 0925   AST 18 12/04/2012 0925   ALT 15 12/04/2012 0925   ALKPHOS 46 12/04/2012 0925   BILITOT 0.4 12/04/2012 0925   GFRNONAA 88* 06/03/2013 0450   GFRAA >90 06/03/2013 0450   CEA on 05/07/2013-1.2  Radiology: As per history of present illness    Assessment/Plan:   1. 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 The tumor return microsatellite stable with no loss of expression of mismatch repair proteins.  2. Multiple colon polyps, status post an ileocecectomy on 06/01/2013 to remove a tubulovillous adenoma   Disposition:   Ms. Klare has been diagnosed with stage II colon cancer. I discussed the diagnosis, prognosis, and adjuvant treatment options with Ms. Yaney and her daughters. We reviewed the details of the surgical pathology report.  She has a good prognosis for a long-term disease-free survival. I discussed the lack of data to support a clear benefit from adjuvant systemic therapy in patients with resected stage II colon cancer. Her tumor does not have "high-risk "features such as an associated bowel perforation, high-grade histology, lymphovascular invasion, or limited lymph node sampling. The preoperative CEA was normal. I do not recommend adjuvant chemotherapy in her case. We discussed diet and exercise maneuvers to potentially decrease the risk of developing colon cancer. Her family members should be made aware of her diagnosis and receive appropriate screening. She should have a colonoscopy in one year and then continue surveillance colonoscopies with Dr. Loreta Ave.  I recommend a CEA every 6 months for the next 2 years and then yearly. She would like to continue clinical followup via Carilyn Goodpasture. I also recommend a chest x-ray to complete  staging.  Ms. Sautter is not scheduled for a followup appointment at the [facility]. We will be glad to see her in the future as needed.  Approximately 45 minutes were spent with the patient today. The majority of the time was used for counseling and coordination of care.  Shyia Fillingim 06/28/2013, 5:25 PM

## 2013-06-28 NOTE — Progress Notes (Signed)
Met with Laurie Duarte and family. Explained role of nurse navigator. Educational information provided on colon cancer  No treatment recommended per MD.  Per MD, patient is to have a colonoscopy in 1 year, follow a healthy, low-fat diet and exercise.  Patient was encouraged to inform her children to discuss with their physicians when they would need to have screening done for colon cancer.  [facility] resources provided to patient. Referral to dietician for diet education was declined per patient as she is already working with a dietician. Contact names and phone numbers were provided for self and MD.  Teach back method was used.  No barriers to care identified.  Patient was without questions and verbalized understanding.  She stated she would follow-up with her primary care, Dr. Yehuda Mao, for bloodwork and PE.

## 2013-06-28 NOTE — Progress Notes (Signed)
Checked in new patient with no financial issues. She has appt card and not been to [place].

## 2013-06-29 ENCOUNTER — Ambulatory Visit (INDEPENDENT_AMBULATORY_CARE_PROVIDER_SITE_OTHER): Payer: BC Managed Care – PPO | Admitting: General Surgery

## 2013-06-29 ENCOUNTER — Encounter (INDEPENDENT_AMBULATORY_CARE_PROVIDER_SITE_OTHER): Payer: Self-pay | Admitting: General Surgery

## 2013-06-29 VITALS — BP 130/78 | HR 68 | Temp 98.0°F | Resp 14 | Ht 65.0 in | Wt 192.4 lb

## 2013-06-29 DIAGNOSIS — Z4889 Encounter for other specified surgical aftercare: Secondary | ICD-10-CM

## 2013-06-29 DIAGNOSIS — Z5189 Encounter for other specified aftercare: Secondary | ICD-10-CM

## 2013-06-29 NOTE — Progress Notes (Signed)
Subjective:     Patient ID: Laurie Duarte, female   DOB: [birth year], 62 y.o.   MRN: [id number]  HPI This patient follows up status post partial colectomy for colon cancer. Her final pathology was consistent with a stage II cancer. She has since seen the medical oncologist and no further treatment was recommended. She is here today for a wound check. She has been doing well and her wound is almost healed but she does have some intermittent hardness and drainage from the lower portion of her wound. She has no fevers or chills and her abdominal pain. She says that her bowels move about every other day and denies any blood in the stools.  Review of Systems     Objective:   Physical Exam Her wound is healing nicely and the lower aspect of the wound is almost completely healed at the very lower aspect she has a pinpoint opening which I probed with a Q-tip and does track deeper but there is no purulence. I packed a 1/4 inch Nu Gauze wick into the wound and she will continue to do this as well the next several days. The upper portion of the wound is granulating nicely and shouldn't need that much additional wound care. I applied a silver nitrate to the midportion of the wound which was almost completely healed    Assessment:     Status postpartial colectomy for colon cancer-doing well Her wound is healing nicely. This is almost completely healed. There was a small area at the inferior aspect of the wound which did accommodate a small wick and she will continue to do this daily as long as the wound permits. Otherwise this should heal up shortly. I will see her back in about 2 weeks for repeat evaluation and when checked    Plan:     followup in 2 weeks for a week the wound check and continue his current wound care

## 2013-07-02 ENCOUNTER — Telehealth: Payer: Self-pay | Admitting: Oncology

## 2013-07-02 ENCOUNTER — Telehealth: Payer: Self-pay | Admitting: *Deleted

## 2013-07-02 NOTE — Telephone Encounter (Signed)
Per Dr. Truett Perna, this RN spoke with Carilyn Goodpasture, PA by phone.  She is aware of patient's request to continue follow-up in her office.  She is aware of need to order CXR to complete staging work-up and CEA every 6 months x 2 years, then annually.  She is aware of Dr. Kalman Drape order for Iron replacement and follow-up of Hgb level.  She will contact Dr. Truett Perna if any questions arise re: cancer care follow-up.  Dr. Kalman Drape office note from 06/28/13 was faxed and confirmed to Carilyn Goodpasture, PA who will contact the patient re: above.

## 2013-07-02 NOTE — Telephone Encounter (Signed)
Per 11/6 pof f/u TBA °

## 2013-07-03 ENCOUNTER — Other Ambulatory Visit: Payer: Self-pay | Admitting: Family Medicine

## 2013-07-03 ENCOUNTER — Ambulatory Visit
Admission: RE | Admit: 2013-07-03 | Discharge: 2013-07-03 | Disposition: A | Discharge status: Home / Self Care | Payer: BC Managed Care – PPO | Source: Ambulatory Visit | Attending: Family Medicine | Admitting: Family Medicine

## 2013-07-03 DIAGNOSIS — Z85038 Personal history of other malignant neoplasm of large intestine: Secondary | ICD-10-CM

## 2013-07-04 ENCOUNTER — Encounter (HOSPITAL_COMMUNITY): Payer: Self-pay | Admitting: *Deleted

## 2013-07-04 ENCOUNTER — Encounter (INDEPENDENT_AMBULATORY_CARE_PROVIDER_SITE_OTHER): Payer: Self-pay | Admitting: General Surgery

## 2013-07-04 ENCOUNTER — Ambulatory Visit (INDEPENDENT_AMBULATORY_CARE_PROVIDER_SITE_OTHER): Payer: BC Managed Care – PPO | Admitting: General Surgery

## 2013-07-04 ENCOUNTER — Observation Stay (HOSPITAL_COMMUNITY): Payer: BC Managed Care – PPO | Admitting: Anesthesiology

## 2013-07-04 ENCOUNTER — Observation Stay (HOSPITAL_COMMUNITY)
Admission: AD | Admit: 2013-07-04 | Discharge: 2013-07-05 | Disposition: A | Discharge status: Home / Self Care | Payer: BC Managed Care – PPO | Source: Ambulatory Visit | Attending: General Surgery | Admitting: General Surgery

## 2013-07-04 ENCOUNTER — Encounter (HOSPITAL_COMMUNITY): Payer: BC Managed Care – PPO | Admitting: Anesthesiology

## 2013-07-04 ENCOUNTER — Encounter (HOSPITAL_COMMUNITY)
Admission: AD | Disposition: A | Discharge status: Home / Self Care | Payer: Self-pay | Source: Ambulatory Visit | Attending: General Surgery

## 2013-07-04 VITALS — BP 118/76 | HR 80 | Temp 99.1°F | Resp 15 | Ht 65.0 in | Wt 187.6 lb

## 2013-07-04 DIAGNOSIS — T798XXD Other early complications of trauma, subsequent encounter: Secondary | ICD-10-CM

## 2013-07-04 DIAGNOSIS — Z5189 Encounter for other specified aftercare: Secondary | ICD-10-CM

## 2013-07-04 DIAGNOSIS — T8140XA Infection following a procedure, unspecified, initial encounter: Secondary | ICD-10-CM

## 2013-07-04 DIAGNOSIS — L02219 Cutaneous abscess of trunk, unspecified: Principal | ICD-10-CM | POA: Insufficient documentation

## 2013-07-04 DIAGNOSIS — C189 Malignant neoplasm of colon, unspecified: Secondary | ICD-10-CM | POA: Insufficient documentation

## 2013-07-04 HISTORY — PX: WOUND DEBRIDEMENT: SHX247

## 2013-07-04 LAB — CBC WITH DIFFERENTIAL/PLATELET
Basophils Relative: 1 % (ref 0–1)
Eosinophils Relative: 2 % (ref 0–5)
Hemoglobin: 10.7 g/dL — ABNORMAL LOW (ref 12.0–15.0)
Lymphocytes Relative: 29 % (ref 12–46)
Lymphs Abs: 2.6 10*3/uL (ref 0.7–4.0)
MCV: 77.9 fL — ABNORMAL LOW (ref 78.0–100.0)
Monocytes Absolute: 0.8 10*3/uL (ref 0.1–1.0)
Monocytes Relative: 9 % (ref 3–12)
Neutro Abs: 5.2 10*3/uL (ref 1.7–7.7)
Neutrophils Relative %: 59 % (ref 43–77)
Platelets: 371 10*3/uL (ref 150–400)
RBC: 4.26 MIL/uL (ref 3.87–5.11)
WBC: 8.8 10*3/uL (ref 4.0–10.5)

## 2013-07-04 LAB — SURGICAL PCR SCREEN
MRSA, PCR: NEGATIVE
Staphylococcus aureus: POSITIVE — AB

## 2013-07-04 SURGERY — DEBRIDEMENT, WOUND, ABDOMEN
Anesthesia: General | Site: Abdomen | Wound class: Clean Contaminated

## 2013-07-04 MED ORDER — BUPIVACAINE HCL (PF) 0.25 % IJ SOLN
INTRAMUSCULAR | Status: AC
  Filled 2013-07-04: qty 30

## 2013-07-04 MED ORDER — LIDOCAINE-EPINEPHRINE 1 %-1:100000 IJ SOLN
INTRAMUSCULAR | Status: AC
  Filled 2013-07-04: qty 1

## 2013-07-04 MED ORDER — LACTATED RINGERS IV SOLN: INTRAVENOUS | Status: DC

## 2013-07-04 MED ORDER — KETOROLAC TROMETHAMINE 30 MG/ML IJ SOLN: 15.0000 mg | Freq: Once | INTRAMUSCULAR | Status: DC | PRN

## 2013-07-04 MED ORDER — MIDAZOLAM HCL 5 MG/5ML IJ SOLN
INTRAMUSCULAR | Status: DC | PRN
  Administered 2013-07-04: 2 mg via INTRAVENOUS

## 2013-07-04 MED ORDER — ONDANSETRON HCL 4 MG/2ML IJ SOLN: 4.0000 mg | Freq: Four times a day (QID) | INTRAMUSCULAR | Status: DC | PRN

## 2013-07-04 MED ORDER — MIDAZOLAM HCL 2 MG/2ML IJ SOLN: 0.5000 mg | INTRAMUSCULAR | Status: DC | PRN

## 2013-07-04 MED ORDER — SODIUM CHLORIDE 0.9 % IV SOLN
INTRAVENOUS | Status: DC
  Administered 2013-07-04 (×2): via INTRAVENOUS

## 2013-07-04 MED ORDER — SODIUM CHLORIDE 0.9 % IV SOLN
3.0000 g | Freq: Four times a day (QID) | INTRAVENOUS | Status: DC
  Administered 2013-07-04 – 2013-07-05 (×4): 3 g via INTRAVENOUS
  Filled 2013-07-04 (×10): qty 3

## 2013-07-04 MED ORDER — PROPOFOL 10 MG/ML IV BOLUS
INTRAVENOUS | Status: DC | PRN
  Administered 2013-07-04: 150 mg via INTRAVENOUS

## 2013-07-04 MED ORDER — BUPIVACAINE HCL (PF) 0.25 % IJ SOLN
INTRAMUSCULAR | Status: DC | PRN
  Administered 2013-07-04: 15 mL

## 2013-07-04 MED ORDER — FENTANYL CITRATE 0.05 MG/ML IJ SOLN: 25.0000 ug | INTRAMUSCULAR | Status: DC | PRN

## 2013-07-04 MED ORDER — LIDOCAINE-EPINEPHRINE 1 %-1:100000 IJ SOLN
INTRAMUSCULAR | Status: DC | PRN
  Administered 2013-07-04: 15 mL

## 2013-07-04 MED ORDER — OXYCODONE HCL 5 MG PO TABS
5.0000 mg | ORAL_TABLET | ORAL | Status: DC | PRN
  Administered 2013-07-04: 5 mg via ORAL
  Filled 2013-07-04: qty 1

## 2013-07-04 MED ORDER — MEPERIDINE HCL 50 MG/ML IJ SOLN: 6.2500 mg | INTRAMUSCULAR | Status: DC | PRN

## 2013-07-04 MED ORDER — LACTATED RINGERS IV SOLN
INTRAVENOUS | Status: DC | PRN
  Administered 2013-07-04: 20:00:00 via INTRAVENOUS

## 2013-07-04 MED ORDER — ADULT MULTIVITAMIN W/MINERALS CH
1.0000 | ORAL_TABLET | Freq: Every day | ORAL | Status: DC
  Filled 2013-07-04: qty 1

## 2013-07-04 MED ORDER — ONDANSETRON HCL 4 MG/2ML IJ SOLN
INTRAMUSCULAR | Status: DC | PRN
  Administered 2013-07-04: 4 mg via INTRAVENOUS

## 2013-07-04 MED ORDER — MORPHINE SULFATE 10 MG/ML IJ SOLN: 2.0000 mg | INTRAMUSCULAR | Status: DC | PRN

## 2013-07-04 MED ORDER — PROMETHAZINE HCL 25 MG/ML IJ SOLN: 6.2500 mg | INTRAMUSCULAR | Status: DC | PRN

## 2013-07-04 MED ORDER — LIDOCAINE HCL (CARDIAC) 20 MG/ML IV SOLN
INTRAVENOUS | Status: DC | PRN
  Administered 2013-07-04: 50 mg via INTRAVENOUS

## 2013-07-04 MED ORDER — FENTANYL CITRATE 0.05 MG/ML IJ SOLN
INTRAMUSCULAR | Status: DC | PRN
  Administered 2013-07-04 (×4): 25 ug via INTRAVENOUS

## 2013-07-04 SURGICAL SUPPLY — 41 items
BANDAGE GAUZE ELAST BULKY 4 IN (GAUZE/BANDAGES/DRESSINGS) ×2 IMPLANT
BLADE HEX COATED 2.75 (ELECTRODE) ×2 IMPLANT
BLADE SURG 15 STRL LF DISP TIS (BLADE) ×1 IMPLANT
BLADE SURG 15 STRL SS (BLADE) ×1
BLADE SURG SZ10 CARB STEEL (BLADE) ×2 IMPLANT
CANISTER SUCTION 2500CC (MISCELLANEOUS) ×2 IMPLANT
CLOTH BEACON ORANGE TIMEOUT ST (SAFETY) ×2 IMPLANT
DECANTER SPIKE VIAL GLASS SM (MISCELLANEOUS) ×2 IMPLANT
DERMABOND ADVANCED (GAUZE/BANDAGES/DRESSINGS)
DERMABOND ADVANCED .7 DNX12 (GAUZE/BANDAGES/DRESSINGS) IMPLANT
DRAPE LAPAROSCOPIC ABDOMINAL (DRAPES) IMPLANT
DRAPE LAPAROTOMY T 102X78X121 (DRAPES) ×2 IMPLANT
DRAPE LAPAROTOMY TRNSV 102X78 (DRAPE) IMPLANT
DRAPE LG THREE QUARTER DISP (DRAPES) ×2 IMPLANT
ELECT REM PT RETURN 9FT ADLT (ELECTROSURGICAL) ×2
ELECTRODE REM PT RTRN 9FT ADLT (ELECTROSURGICAL) ×1 IMPLANT
GLOVE BIO SURGEON STRL SZ7 (GLOVE) ×6 IMPLANT
GLOVE BIOGEL PI IND STRL 7.0 (GLOVE) ×1 IMPLANT
GLOVE BIOGEL PI INDICATOR 7.0 (GLOVE) ×1
GLOVE SURG SS PI 7.5 STRL IVOR (GLOVE) ×4 IMPLANT
GOWN PREVENTION PLUS LG XLONG (DISPOSABLE) ×2 IMPLANT
GOWN STRL REIN XL XLG (GOWN DISPOSABLE) IMPLANT
KIT BASIN OR (CUSTOM PROCEDURE TRAY) ×2 IMPLANT
MARKER SKIN DUAL TIP RULER LAB (MISCELLANEOUS) ×2 IMPLANT
NEEDLE HYPO 25X1 1.5 SAFETY (NEEDLE) ×2 IMPLANT
NS IRRIG 1000ML POUR BTL (IV SOLUTION) ×2 IMPLANT
PACK BASIC VI WITH GOWN DISP (CUSTOM PROCEDURE TRAY) ×2 IMPLANT
PAD ABD 7.5X8 STRL (GAUZE/BANDAGES/DRESSINGS) ×2 IMPLANT
PENCIL BUTTON HOLSTER BLD 10FT (ELECTRODE) ×2 IMPLANT
SPONGE GAUZE 4X4 12PLY (GAUZE/BANDAGES/DRESSINGS) ×2 IMPLANT
SPONGE LAP 18X18 X RAY DECT (DISPOSABLE) IMPLANT
SPONGE LAP 4X18 X RAY DECT (DISPOSABLE) IMPLANT
STAPLER VISISTAT 35W (STAPLE) IMPLANT
SUT MNCRL AB 4-0 PS2 18 (SUTURE) IMPLANT
SUT VIC AB 3-0 SH 18 (SUTURE) IMPLANT
SWAB COLLECTION DEVICE MRSA (MISCELLANEOUS) ×2 IMPLANT
SYR CONTROL 10ML LL (SYRINGE) ×2 IMPLANT
TAPE CLOTH SURG 4X10 WHT LF (GAUZE/BANDAGES/DRESSINGS) ×2 IMPLANT
TOWEL OR 17X26 10 PK STRL BLUE (TOWEL DISPOSABLE) ×2 IMPLANT
TUBE ANAEROBIC SPECIMEN COL (MISCELLANEOUS) ×2 IMPLANT
YANKAUER SUCT BULB TIP 10FT TU (MISCELLANEOUS) ×2 IMPLANT

## 2013-07-04 NOTE — Progress Notes (Signed)
Patient ID: Laurie Duarte, female   DOB: 1950/08/30, 63 y.o.   MRN: [id number]  Chief Complaint  Patient presents with  . Routine Post Op    reck wound, pt said had red streak    HPI Laurie Duarte is a 62 y.o. female.  F/u 1 month s/p partial colectomy.  She had some of her wound opened previously by Dr. Ezzard Standing and this has almost healed.  She has had some drainage from the lower portion of her wound and it has been itchy.  It has increased in redness since yesterday.  No fevers.  HPI  Past Medical History  Diagnosis Date  . Benign heart murmur   . Anemia   . Cancer     colon  . Hyperlipidemia   . Psoriasis     Past Surgical History  Procedure Laterality Date  . Tubal ligation    . Polyp removal      nasal cavities  . Incision and drainage intra oral abscess  1 2014  . Partial colectomy N/A 06/01/2013    Procedure: SIGMOID COLECTOMY AND RIGHT  COLECTOMY;  Surgeon: Lodema Pilot, DO;  Location: [facility];  Service: General;  Laterality: N/A;  . Application of wound vac N/A 06/01/2013    Procedure: APPLICATION OF WOUND VAC;  Surgeon: Lodema Pilot, DO;  Location: [facility];  Service: General;  Laterality: N/A;    Family History  Problem Relation Age of Onset  . Diabetes Mother   . Hypertension Mother   . Heart disease Mother   . Heart disease Father   . Asthma Father   . Diabetes Maternal Grandmother   . Heart disease Brother   . Lupus Daughter   . Mental illness Daughter     ptsd    Social History History  Substance Use Topics  . Smoking status: Never Smoker   . Smokeless tobacco: Never Used  . Alcohol Use: Yes     Comment: occ.    No Known Allergies  Current Outpatient Prescriptions  Medication Sig Dispense Refill  . ACAI PO Take 1,000 mg by mouth 2 (two) times daily.       . calcium-vitamin D (OSCAL WITH D) 500-200 MG-UNIT per tablet Take 1 tablet by mouth.      . COD LIVER OIL PO Take 2-3 tablets by mouth daily.       . ferrous fumarate (HEMOCYTE - 106 MG FE) 325  (106 FE) MG TABS tablet Take 1 tablet by mouth 2 (two) times daily.      . Lactobacillus (ACIDOPHOLUS PO) Take 1 tablet by mouth daily.       . Multiple Vitamin (MULTIVITAMIN WITH MINERALS) TABS tablet Take 1 tablet by mouth daily.      Marland Kitchen oxyCODONE (OXY IR/ROXICODONE) 5 MG immediate release tablet Take 5 mg by mouth every 4 (four) hours as needed for pain.       No current facility-administered medications for this visit.    Review of Systems Review of Systems All other review of systems negative or noncontributory except as stated in the HPI  Blood pressure 118/76, pulse 80, temperature 99.1 F (37.3 C), temperature source Temporal, resp. rate 15, height 5\' 5"  (1.651 m), weight 187 lb 9.3 oz (85.086 kg).  Physical Exam Physical Exam Physical Exam  Nursing note and vitals reviewed. Constitutional: She is oriented to person, place, and time. She appears well-developed and well-nourished. No distress.  HENT:  Head: Normocephalic and atraumatic.  Mouth/Throat: No oropharyngeal exudate.  Eyes:  Conjunctivae and EOM are normal. Pupils are equal, round, and reactive to light. Right eye exhibits no discharge. Left eye exhibits no discharge. No scleral icterus.  Neck: Normal range of motion. Neck supple. No tracheal deviation present.  Cardiovascular: Normal rate, regular rhythm, normal heart sounds and intact distal pulses.   Pulmonary/Chest: Effort normal and breath sounds normal. No stridor. No respiratory distress. She has no wheezes.  Abdominal: Soft. Bowel sounds are normal. She exhibits no distension and no mass. There is no tenderness. There is no rebound and no guarding. She has some induration and erythema of the lower portion of her incision.  She has some purulent drainage from the small opening at the bottom of the incision.   Musculoskeletal: Normal range of motion. She exhibits no edema and no tenderness.  Neurological: She is alert and oriented to person, place, and time.  Skin:  Skin is warm and dry. No rash noted. She is not diaphoretic. No erythema. No pallor.  Psychiatric: She has a normal mood and affect. Her behavior is normal. Judgment and thought content normal.    Data Reviewed   Assessment    Wound infection I think that she has a wound infection at the lower portion of the wound.  I have recommended I/D.  she had some breakfast so we will need to do this later today.  I will admit her and plan for I/D     Plan    Admit for I/D of abdominal wound later today        Laurie Duarte DAVID 07/04/2013, 9:40 AM

## 2013-07-04 NOTE — Anesthesia Preprocedure Evaluation (Signed)
Anesthesia Evaluation  Patient identified by MRN, date of birth, ID band Patient awake    Reviewed: Allergy & Precautions, H&P , Patient's Chart, lab work & pertinent test results, reviewed documented beta blocker date and time   History of Anesthesia Complications Negative for: history of anesthetic complications  Airway Mallampati: II TM Distance: >3 FB Neck ROM: full    Dental no notable dental hx.    Pulmonary neg pulmonary ROS,  breath sounds clear to auscultation  Pulmonary exam normal       Cardiovascular Exercise Tolerance: Good negative cardio ROS  Rhythm:regular Rate:Normal     Neuro/Psych negative neurological ROS  negative psych ROS   GI/Hepatic negative GI ROS, Neg liver ROS,   Endo/Other  negative endocrine ROS  Renal/GU negative Renal ROS     Musculoskeletal   Abdominal   Peds  Hematology negative hematology ROS (+) anemia ,   Anesthesia Other Findings Benign heart murmur  Reproductive/Obstetrics negative OB ROS                           Anesthesia Physical Anesthesia Plan  ASA: II  Anesthesia Plan: General LMA   Post-op Pain Management:    Induction:   Airway Management Planned:   Additional Equipment:   Intra-op Plan:   Post-operative Plan:   Informed Consent: I have reviewed the patients History and Physical, chart, labs and discussed the procedure including the risks, benefits and alternatives for the proposed anesthesia with the patient or authorized representative who has indicated his/her understanding and acceptance.   Dental Advisory Given  Plan Discussed with: CRNA, Surgeon and Anesthesiologist  Anesthesia Plan Comments:         Anesthesia Quick Evaluation

## 2013-07-04 NOTE — Progress Notes (Signed)
Report given to [facility] RN for 1523.

## 2013-07-04 NOTE — Brief Op Note (Signed)
07/04/2013  8:58 PM  PATIENT:  Laurie Duarte  62 y.o. female  PRE-OPERATIVE DIAGNOSIS:  ABDOMINAL WOUND   POST-OPERATIVE DIAGNOSIS:  ABDOMINAL WOUND   PROCEDURE:  Procedure(s): IRRIGATION AND DEBRIDEMENT ABDOMINAL WOUND (N/A)  SURGEON:  Surgeon(s) and Role:    * Lodema Pilot, DO - Primary  PHYSICIAN ASSISTANT:   ASSISTANTS: none   ANESTHESIA:   general  EBL:     BLOOD ADMINISTERED:none  DRAINS: wound packing  LOCAL MEDICATIONS USED:  MARCAINE    and LIDOCAINE   SPECIMEN:  Source of Specimen:  cultures  DISPOSITION OF SPECIMEN:  micro  COUNTS:  YES  TOURNIQUET:  * No tourniquets in log *  DICTATION: .Other Dictation: Dictation Number [id number]  PLAN OF CARE: Admit for overnight observation  PATIENT DISPOSITION:  PACU - hemodynamically stable.   Delay start of Pharmacological VTE agent (>24hrs) due to surgical blood loss or risk of bleeding: no

## 2013-07-04 NOTE — Anesthesia Postprocedure Evaluation (Signed)
  Anesthesia Post-op Note  Patient: Laurie Duarte  Procedure(s) Performed: Procedure(s): IRRIGATION AND DEBRIDEMENT ABDOMINAL WOUND (N/A)  Patient Location: PACU  Anesthesia Type:General  Level of Consciousness: awake and alert   Airway and Oxygen Therapy: Patient Spontanous Breathing and Patient connected to nasal cannula oxygen  Post-op Pain: none  Post-op Assessment: Post-op Vital signs reviewed and Patient's Cardiovascular Status Stable  Post-op Vital Signs: Reviewed and stable  Complications: No apparent anesthesia complications

## 2013-07-04 NOTE — Transfer of Care (Signed)
Immediate Anesthesia Transfer of Care Note  Patient: Laurie Duarte  Procedure(s) Performed: Procedure(s) (LRB): IRRIGATION AND DEBRIDEMENT ABDOMINAL WOUND (N/A)  Patient Location: PACU  Anesthesia Type: General  Level of Consciousness: sedated, patient cooperative and responds to stimulation  Airway & Oxygen Therapy: Patient Spontanous Breathing and Patient connected to face mask oxgen  Post-op Assessment: Report given to PACU RN and Post -op Vital signs reviewed and stable  Post vital signs: Reviewed and stable  Complications: No apparent anesthesia complications

## 2013-07-05 ENCOUNTER — Encounter (HOSPITAL_COMMUNITY): Payer: Self-pay | Admitting: General Surgery

## 2013-07-05 MED ORDER — LEVOFLOXACIN 750 MG PO TABS: 750.0000 mg | ORAL_TABLET | Freq: Every day | ORAL | Status: DC

## 2013-07-05 MED ORDER — LEVOFLOXACIN 750 MG PO TABS
750.0000 mg | ORAL_TABLET | Freq: Every day | ORAL | Status: DC
Start: 2013-07-05 — End: 2013-07-05
  Administered 2013-07-05: 750 mg via ORAL
  Filled 2013-07-05: qty 1

## 2013-07-05 MED ORDER — AMOXICILLIN-POT CLAVULANATE 875-125 MG PO TABS
1.0000 | ORAL_TABLET | Freq: Two times a day (BID) | ORAL | Status: DC
  Filled 2013-07-05 (×2): qty 1

## 2013-07-05 MED ORDER — OXYCODONE HCL 5 MG PO TABS: 5.0000 mg | ORAL_TABLET | ORAL | Status: DC | PRN

## 2013-07-05 NOTE — Progress Notes (Signed)
Utilization review completed.  

## 2013-07-05 NOTE — Progress Notes (Signed)
1 Day Post-Op  Subjective: No pain  Objective: Vital signs in last 24 hours: Temp:  [98 F (36.7 C)-99.6 F (37.6 C)] 98.1 F (36.7 C) (11/13 0604) Pulse Rate:  [69-90] 73 (11/13 0604) Resp:  [10-18] 16 (11/13 0604) BP: (108-144)/(64-87) 118/72 mmHg (11/13 0604) SpO2:  [97 %-100 %] 97 % (11/13 0604) Weight:  [187 lb (84.823 kg)-187 lb 9.3 oz (85.086 kg)] 187 lb (84.823 kg) (11/12 1029) Last BM Date: 07/04/13  Intake/Output from previous day: 11/12 0701 - 11/13 0700 In: 2663.3 [P.O.:360; I.V.:2303.3] Out: 1050 [Urine:1050] Intake/Output this shift:    General appearance: alert, cooperative and no distress Resp: nolabored Cardio: normal rate, regular GI: soft, no significant tenderness, ND, erythema already looks better  Lab Results:   Recent Labs  07/04/13 1055  WBC 8.8  HGB 10.7*  HCT 33.2*  PLT 371   BMET No results found for this basename: NA, K, CL, CO2, GLUCOSE, BUN, CREATININE, CALCIUM,  in the last 72 hours PT/INR No results found for this basename: LABPROT, INR,  in the last 72 hours ABG No results found for this basename: PHART, PCO2, PO2, HCO3,  in the last 72 hours  Studies/Results: Dg Chest 2 View  07/03/2013   CLINICAL DATA:  Colon cancer  EXAM: CHEST  2 VIEW  COMPARISON:  Min  FINDINGS: Heart size upper normal. Vascularity is normal. Lungs are clear without infiltrate effusion or mass. No lung nodule.  IMPRESSION: No active cardiopulmonary disease.   Electronically Signed   By: Marlan Palau M.D.   On: 07/03/2013 15:06    Anti-infectives: Anti-infectives   Start     Dose/Rate Route Frequency Ordered Stop   07/05/13 1000  amoxicillin-clavulanate (AUGMENTIN) 875-125 MG per tablet 1 tablet     1 tablet Oral Every 12 hours 07/05/13 0720     07/04/13 1200  Ampicillin-Sulbactam (UNASYN) 3 g in sodium chloride 0.9 % 100 mL IVPB  Status:  Discontinued     3 g 100 mL/hr over 60 Minutes Intravenous Every 6 hours 07/04/13 1034 07/05/13 0720       Assessment/Plan: s/p Procedure(s): IRRIGATION AND DEBRIDEMENT ABDOMINAL WOUND (N/A) she looks and feels well. erythema already looks much better.  should be okay for discharge today after dressing change.  plan on changing to oral abx. prior wound was pseudamonas so will send her home with levaquin  LOS: 1 day    Lodema Pilot DAVID 07/05/2013

## 2013-07-05 NOTE — Progress Notes (Signed)
Assessment unchanged. Pt verbalized understanding of dc instructions through teach back. Scripts x 2 given as provided by MD. Pt already has My Chart account and uses often. Dressing change to distal incisional wound area done prior to dc with verbalized & demonstrated understanding of pt. Pt had previously preformed wet to dry dressings at home on another area of abd incision. Stated, "I understand and will be doing this dressing change myself." Dressing supplies provided as needed. discahrged via wc to front entrance to meet awaiting vehicle to carry home. Accompanied by daughter and NT.

## 2013-07-06 NOTE — Op Note (Signed)
Laurie Duarte, ALWIN NO.:  [id number]  MEDICAL RECORD NO.:  [id number]  LOCATION:  1523                         FACILITY:  [facility]  PHYSICIAN:  Lodema Pilot, MD       DATE OF BIRTH:  Jun 22, 1951  DATE OF PROCEDURE:  07/04/2013 DATE OF DISCHARGE:                              OPERATIVE REPORT   PROCEDURE:  Incision and drainage of abdominal wound.  PREOPERATIVE DIAGNOSIS:  Abdominal wound infection.  POSTOPERATIVE DIAGNOSIS:  Abdominal wound infection.  SURGEON:  Lodema Pilot, MD  ASSISTANT:  None.  ANESTHESIA:  General LMA anesthesia.  FLUIDS:  300 mL crystalloid.  ESTIMATED BLOOD LOSS:  Minimal.  DRAINS:  Wound packing.  SPECIMENS:  Wound cultures sent to microbiology for Gram stain and culture.  COMPLICATIONS:  None apparent.  FINDINGS:  Superficial subcutaneous abscess.  INDICATION FOR PROCEDURE:  [name] is a 62 year old female, who is 1 month status post partial colectomy for colon cancer and unresectable colon polyp.  She did well initially from the surgery, although she had part of her wound opened up previously for concerns for possible wound infection.  She is apparently doing fine and then recently has developed some drainage and induration of lower aspect of her wound.  She yesterday began having some cellulitis and although she was not having significant pain, she complained of itching in the area.  On exam, she had a concern for abscess as well as some drainage from the lower portion of the wound.  OPERATIVE DETAILS:  [name] was admitted from the clinic earlier for incision and drainage of abdominal wound and placed on antibiotics and taken to the operating room, placed on table in supine position. General LMA anesthesia was obtained and her abdomen was prepped and draped in a standard surgical fashion.  Procedure time-out was performed with all operative team members to confirm proper patient, procedure, and I probed the  small opening with the hemostat and wound seemed to tract cephalad.  I made an elliptical incision to encompass her prior scars more cephalad and did not get much purulence from this area, although I pushed through the subcutaneous tissue with my finger towards the patient's right in the area of induration and immediately had a return of some purulent fluid and entered the abscess cavity.  Cultures were taken and sent to microbiology.  I opened up some additional tissue on the patient's right tube facilitated wound packing and probed the area for any un-drained fluid collection and none was identified.  I debrided some of the fibrinous tissue from the inferior aspect of the wound.  The abscess cavity did not appear to violate the fascia posteriorly.  Hemostasis obtained with Bovie electrocautery and the wound was noted be hemostatic.  I irrigated the wound with sterile saline solution and after the wound was hemostatic, I packed the wound with Kerlix gauze, moistened with 1% lidocaine with epinephrine, and 0.25% Marcaine in a 50:50 mixture and sterile dressing was applied.  All sponge, needle, and instrument counts were correct at the end of the case.  The patient tolerated the procedure well without apparent complications.  ______________________________ Lodema Pilot, MD     BL/MEDQ  D:  07/04/2013  T:  07/05/2013  Job:  [id number]

## 2013-07-06 NOTE — Discharge Summary (Signed)
Physician Discharge Summary  Patient ID: Laurie Duarte MRN: [id number] DOB/AGE: 1950/11/25 62 y.o.  Admit date: 07/04/2013 Discharge date: 07/05/2013  Admission Diagnoses: wound infection  Discharge Diagnoses: same Active Problems:   * No active hospital problems. *   Discharged Condition: stable  Hospital Course: admitted from the clinic for abdominal wound infection.  Taken to the OR on 07/04/13 for I/D of abdominal wound.  Tolerated well.  Diet advanced and dressing changed on POD 1.  She was ready for discharge to home on POD 1   Consults: None  Significant Diagnostic Studies: none  Treatments: surgery: I/D abdominal wound 07/04/13  Disposition: 01-Home or Self Care  Discharge Orders   Future Appointments Provider Department Dept Phone   07/13/2013 12:15 PM Lodema Pilot, [facility], [place] [phone]   Future Orders Complete By Expires   Call MD for:  redness, tenderness, or signs of infection (pain, swelling, redness, odor or green/yellow discharge around incision site)  As directed    Call MD for:  severe uncontrolled pain  As directed    Call MD for:  temperature >100.4  As directed    Change dressing (specify)  As directed    Comments:     Dressing change: 2 times per day using damp to dry gauze packing.   Diet - low sodium heart healthy  As directed    Discharge instructions  As directed    Comments:     Pack the wound damp to dry with gauze twice daily Call [phone] to schedule follow up with Dr. Biagio Quint in about 1 week or sooner as needed. May shower   Increase activity slowly  As directed        Medication List         ACAI PO  Take 1,000 mg by mouth 2 (two) times daily.     ACIDOPHOLUS PO  Take 1 tablet by mouth daily.     calcium-vitamin D 500-200 MG-UNIT per tablet  Commonly known as:  OSCAL WITH D  Take 1 tablet by mouth.     COD LIVER OIL PO  Take 2 tablets by mouth daily.     ferrous fumarate 325 (106 FE) MG Tabs tablet   Commonly known as:  HEMOCYTE - 106 mg FE  Take 1 tablet by mouth 2 (two) times daily. Started on 07/03/13     levofloxacin 750 MG tablet  Commonly known as:  LEVAQUIN  Take 1 tablet (750 mg total) by mouth daily.     multivitamin with minerals Tabs tablet  Take 1 tablet by mouth daily.     oxyCODONE 5 MG immediate release tablet  Commonly known as:  Oxy IR/ROXICODONE  Take 1 tablet (5 mg total) by mouth every 4 (four) hours as needed for moderate pain.     oxyCODONE 5 MG immediate release tablet  Commonly known as:  Oxy IR/ROXICODONE  Take 5 mg by mouth every 4 (four) hours as needed for pain.         SignedLodema Pilot DAVID 07/06/2013, 10:16 AM

## 2013-07-08 LAB — CULTURE, ROUTINE-ABSCESS: Culture: NO GROWTH

## 2013-07-09 LAB — ANAEROBIC CULTURE

## 2013-07-13 ENCOUNTER — Ambulatory Visit (INDEPENDENT_AMBULATORY_CARE_PROVIDER_SITE_OTHER): Payer: BC Managed Care – PPO | Admitting: General Surgery

## 2013-07-13 VITALS — BP 128/74 | HR 76 | Temp 98.0°F | Resp 18 | Ht 65.0 in | Wt 189.0 lb

## 2013-07-13 DIAGNOSIS — Z5189 Encounter for other specified aftercare: Secondary | ICD-10-CM

## 2013-07-13 DIAGNOSIS — Z4889 Encounter for other specified surgical aftercare: Secondary | ICD-10-CM

## 2013-07-13 NOTE — Progress Notes (Signed)
Subjective:     Patient ID: Laurie Duarte, female   DOB: September 01, 1950, 62 y.o.   MRN: [id number]  HPI This patient follows up status post incision and drainage of lower midline wound. She's doing very well from her procedure. She is not having any discomfort. She is packing her wound daily. She denies any fevers or chills and says that the pain that she was having preoperatively has resolved. She says her bowels are functioning normally at about once per day.  Review of Systems     Objective:   Physical Exam No acute distress and nontoxic-appearing  her wound looks healthy with pink granulation tissue. There is no evidence of erythema or induration or residual infection. I repacked her wound with wet to dry dressings today.    Assessment:     Status post incision and drainage of colon wound-doing well She is doing very well from her procedure. The pain that she was having preoperatively had resolved. There is no evidence of ongoing infection. I recommended that she continue with her daily wet to dry dressing changes and that she should followup in about 3 weeks. Hopefully at that time this will be very small and almost completely healed.     Plan:     Continued wet to dry dressing changes Follow up in 3 weeks Followup in one year with colonoscopy and CEA levels and routine cancer screening/surveillance

## 2013-07-17 ENCOUNTER — Encounter (HOSPITAL_COMMUNITY): Payer: Self-pay

## 2013-08-01 ENCOUNTER — Encounter (INDEPENDENT_AMBULATORY_CARE_PROVIDER_SITE_OTHER): Payer: Self-pay | Admitting: General Surgery

## 2013-08-01 ENCOUNTER — Ambulatory Visit (INDEPENDENT_AMBULATORY_CARE_PROVIDER_SITE_OTHER): Payer: BC Managed Care – PPO | Admitting: General Surgery

## 2013-08-01 VITALS — BP 110/80 | HR 84 | Temp 98.6°F | Resp 15 | Ht 65.0 in | Wt 191.4 lb

## 2013-08-01 DIAGNOSIS — Z9889 Other specified postprocedural states: Secondary | ICD-10-CM

## 2013-08-01 NOTE — Progress Notes (Signed)
Procedure:  Sigmoid colectomy and ileocecectomy  Date:  06/01/2013.  Pathology:  T3N0M0 sigmoid colon cancer  History:  She is here for a postoperative visit and wound check. She had a bone infection and was taken back to the operating room by Dr. Biagio Quint for debridement. The wound is shallow and she is no longer packing it. Her energy level is good. Bowels are moving well.  Exam: General- Is in NAD. Abdomen-soft, incision is clean and intact with a 1-1.5 cm very shallow open area inferiorly.  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 No chemotherapy is recommended.  Plan:  Return visit 2 months. The oncologist as recommended a CEA level every 6 months for the next 2 years and then early.  They do not plan to monitor her at the cancer center.

## 2013-08-01 NOTE — Patient Instructions (Signed)
Do not lift anything over 20 pounds for now. Shower daily and apply a dry dressing to the wound.

## 2013-08-31 ENCOUNTER — Ambulatory Visit (INDEPENDENT_AMBULATORY_CARE_PROVIDER_SITE_OTHER): Payer: BC Managed Care – PPO | Admitting: General Surgery

## 2013-08-31 ENCOUNTER — Encounter (INDEPENDENT_AMBULATORY_CARE_PROVIDER_SITE_OTHER): Payer: Self-pay | Admitting: General Surgery

## 2013-08-31 VITALS — BP 130/84 | HR 80 | Temp 97.0°F | Resp 14 | Ht 65.0 in | Wt 198.4 lb

## 2013-08-31 DIAGNOSIS — Z4889 Encounter for other specified surgical aftercare: Secondary | ICD-10-CM

## 2013-08-31 NOTE — Patient Instructions (Signed)
Resume normal activities as tolerated, as we discussed.

## 2013-08-31 NOTE — Progress Notes (Signed)
Procedure:  Sigmoid colectomy and ileocecectomy  Date:  06/01/2013.  Pathology:  T3N0M0 sigmoid colon cancer  History:  She is here for another postoperative visit and wound check. She had a wound infection and was taken back to the operating room by Dr. Lilyan Punt for debridement. The wound has completely healed. Exam: General- Is in NAD. Abdomen-soft, incision is clean and intact with a 1-1.5 cm very shallow open area inferiorly.  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  Wound has completely healed.  Plan:  Return visit 6 months.

## 2013-10-22 ENCOUNTER — Other Ambulatory Visit (HOSPITAL_COMMUNITY): Payer: Self-pay | Admitting: Family Medicine

## 2013-10-22 DIAGNOSIS — Z1231 Encounter for screening mammogram for malignant neoplasm of breast: Secondary | ICD-10-CM

## 2013-10-23 ENCOUNTER — Ambulatory Visit (HOSPITAL_COMMUNITY)
Admission: RE | Admit: 2013-10-23 | Discharge: 2013-10-23 | Disposition: A | Discharge status: Home / Self Care | Payer: BC Managed Care – PPO | Source: Ambulatory Visit | Attending: Family Medicine | Admitting: Family Medicine

## 2013-10-23 DIAGNOSIS — Z1231 Encounter for screening mammogram for malignant neoplasm of breast: Secondary | ICD-10-CM | POA: Insufficient documentation

## 2014-02-27 ENCOUNTER — Ambulatory Visit (INDEPENDENT_AMBULATORY_CARE_PROVIDER_SITE_OTHER): Payer: BC Managed Care – PPO | Admitting: General Surgery

## 2014-02-27 ENCOUNTER — Encounter (INDEPENDENT_AMBULATORY_CARE_PROVIDER_SITE_OTHER): Payer: Self-pay | Admitting: General Surgery

## 2014-02-27 VITALS — BP 136/84 | HR 76 | Temp 97.1°F | Ht 65.0 in | Wt 195.0 lb

## 2014-02-27 DIAGNOSIS — C189 Malignant neoplasm of colon, unspecified: Secondary | ICD-10-CM

## 2014-02-27 NOTE — Patient Instructions (Signed)
Call if you have any problems with your scar.

## 2014-02-27 NOTE — Progress Notes (Signed)
Procedure:  Sigmoid colectomy and ileocecectomy  Date:  06/01/2013.  Pathology:  T3N0M0 sigmoid colon cancer  History:  She is you for long-term followup visit.  Her CEA last month was normal. She is being followed closely by Laurie Levering, PA-C for her colon cancer. Exam: General- Is in NAD. 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  Plan:  Return visit prn.

## 2014-06-24 ENCOUNTER — Encounter (INDEPENDENT_AMBULATORY_CARE_PROVIDER_SITE_OTHER): Payer: Self-pay | Admitting: General Surgery

## 2014-07-05 ENCOUNTER — Telehealth: Payer: Self-pay | Admitting: *Deleted

## 2014-07-05 NOTE — Telephone Encounter (Signed)
Received request from [facility] that Dr. Benay Spice would like pt to have genetics.  Called pt and confirmed 07/22/14 genetic appt w/ her.  Called and left Tonya a voicemail to make her aware.

## 2014-07-22 ENCOUNTER — Other Ambulatory Visit: Payer: BC Managed Care – PPO

## 2014-07-22 ENCOUNTER — Encounter: Payer: Self-pay | Admitting: Genetic Counselor

## 2014-07-22 ENCOUNTER — Ambulatory Visit (HOSPITAL_BASED_OUTPATIENT_CLINIC_OR_DEPARTMENT_OTHER): Payer: BC Managed Care – PPO | Admitting: Genetic Counselor

## 2014-07-22 DIAGNOSIS — Z800 Family history of malignant neoplasm of digestive organs: Secondary | ICD-10-CM

## 2014-07-22 DIAGNOSIS — C187 Malignant neoplasm of sigmoid colon: Secondary | ICD-10-CM

## 2014-07-22 DIAGNOSIS — Z803 Family history of malignant neoplasm of breast: Secondary | ICD-10-CM

## 2014-07-22 DIAGNOSIS — Z8042 Family history of malignant neoplasm of prostate: Secondary | ICD-10-CM

## 2014-07-22 DIAGNOSIS — Z315 Encounter for genetic counseling: Secondary | ICD-10-CM

## 2014-07-22 NOTE — Progress Notes (Signed)
REFERRING PROVIDER: Carlos Levering, PA-C [address] [place], [place] [address]  PRIMARY PROVIDER:  Carlos Levering, PA-C  PRIMARY REASON FOR VISIT:  1. Cancer of sigmoid colon s/p colectomy 06/01/13   2. Family history of colon cancer   3. Family history of prostate cancer in father      HISTORY OF PRESENT ILLNESS:   Laurie Duarte, a 63 y.o. female, was seen for a  cancer genetics consultation at the request of Dr. Percell Miller due to a personal and family history of cancer.  Laurie Duarte presents to clinic today to discuss the possibility of a hereditary predisposition to cancer, genetic testing, and to further clarify her future cancer risks, as well as potential cancer risks for family members.   CANCER HISTORY:  '@ONCDX' @  No history exists.     HISTORY OF PRESENT ILLNESS: In 2014, at the age of 6, Laurie Duarte was diagnosed with cancer of the sigmoid colon. This was treated with surgery in September 2014.  She has a personal history of nasal polyps for which she has had removed.  Laurie Duarte reports a personal history of uterine fibroids as well.   RISK FACTORS:  First live birth at age 10.  Ovaries intact: yes.  Hysterectomy: yes.  Menopausal status: postmenopausal.  Colonoscopy: yes; two colonoscopies.  Removal of 6-7 "precancerous" polyps..   Past Medical History  Diagnosis Date  . Benign heart murmur   . Anemia   . Cancer     colon  . Hyperlipidemia   . Psoriasis     Past Surgical History  Procedure Laterality Date  . Tubal ligation    . Polyp removal      nasal cavities  . Incision and drainage intra oral abscess  1 2014  . Partial colectomy N/A 06/01/2013    Procedure: SIGMOID COLECTOMY AND RIGHT  COLECTOMY;  Surgeon: Madilyn Hook, DO;  Location: [facility];  Service: General;  Laterality: N/A;  . Application of wound vac N/A 06/01/2013    Procedure: APPLICATION OF WOUND VAC;  Surgeon: Madilyn Hook, DO;  Location: [facility];  Service:  General;  Laterality: N/A;  . Wound debridement N/A 07/04/2013    Procedure: IRRIGATION AND DEBRIDEMENT ABDOMINAL WOUND;  Surgeon: Madilyn Hook, DO;  Location: [facility];  Service: General;  Laterality: N/A;    History   Social History  . Marital Status: Married    Spouse Name: N/A    Number of Children: N/A  . Years of Education: N/A   Social History Main Topics  . Smoking status: Never Smoker   . Smokeless tobacco: Never Used  . Alcohol Use: Yes     Comment: occ.  . Drug Use: No  . Sexual Activity: Yes    Birth Control/ Protection: Surgical, Post-menopausal   Other Topics Concern  . Not on file   Social History Narrative     FAMILY HISTORY:  We obtained a detailed, 4-generation family history.  Significant diagnoses are listed below:  Family History  Problem Relation Age of Onset  . Diabetes Mother   . Hypertension Mother   . Heart disease Mother   . Heart disease Father   . Asthma Father   . Prostate cancer Father     dx in his 65s  . Diabetes Maternal Grandmother   . Heart disease Brother   . Lupus Daughter   . Mental illness Daughter     ptsd  . Colon polyps Sister   . Colon polyps Sister   .  Breast cancer Maternal Aunt   . Colon cancer Paternal Aunt     dx in her late 22s  . Colon cancer Paternal Uncle     dx in late 75s  . Colon cancer Cousin     3 paternal cousins in their 49s  . Colon cancer Cousin     dx in his 50s  . Breast cancer Maternal Aunt    Patient's maternal ancestors are of [place] American, [place] and Caucasian ([place]) descent, and paternal ancestors are of African American descent. There is no reported Ashkenazi Jewish ancestry. There is no known consanguinity.  GENETIC COUNSELING ASSESSMENT: Laurie Duarte is a 63 y.o. female with a personal history of colon cancer and family history of prostate, colon and breast cancer which somewhat suggestive of a hereditary cancer syndrome and predisposition to cancer. We, therefore,  discussed and recommended the following at today's visit.   DISCUSSION: We reviewed the characteristics, features and inheritance patterns of hereditary cancer syndromes. We also discussed genetic testing, including the appropriate family members to test, the process of testing, insurance coverage and turn-around-time for results. We discussed the implications of a negative, positive and/or variant of uncertain significant result. Based on the family history of cancer we discussed several conditions including Lynch syndrome, and the POLE and POLD1 genes.  Laurie Duarte had tumor testing that included MSI and IHC testing to screen for Lynch syndrome.  The tumor was MSS and IHC was present.  Therefore the risk for Lynch syndrome is low.  We recommended Laurie Duarte pursue genetic testing for the ColoNext gene panel.   PLAN: Despite our recommendation, Laurie Duarte did not wish to pursue genetic testing at today's visit. We understand this decision, and remain available to coordinate genetic testing at any time in the future. We therefore, recommend Laurie Duarte continue to follow the cancer screening guidelines given by her primary healthcare provider.  Lastly, we encouraged Laurie Duarte to remain in contact with cancer genetics annually so that we can continuously update the family history and inform her of any changes in cancer genetics and testing that may be of benefit for this family.   Ms.  Sabala questions were answered to her satisfaction today. Our contact information was provided should additional questions or concerns arise. Thank you for the referral and allowing [place] to share in the care of your patient.   Nathaneal Sommers P. Florene Glen, [place], [facility] Certified Genetic Counselor [email or web address] phone: [phone]  The patient was seen for a total of 45 minutes in face-to-face genetic counseling.  This patient was discussed with Drs. Magrinat, Lindi Adie and/or Burr Medico who agrees with the above.     _______________________________________________________________________ For Office Staff:  Number of people involved in session: 1 Was an Intern/ student involved with case: no

## 2015-01-30 DIAGNOSIS — Z1211 Encounter for screening for malignant neoplasm of colon: Secondary | ICD-10-CM | POA: Diagnosis not present

## 2015-01-30 DIAGNOSIS — Z85038 Personal history of other malignant neoplasm of large intestine: Secondary | ICD-10-CM | POA: Diagnosis not present

## 2015-03-14 DIAGNOSIS — Z0000 Encounter for general adult medical examination without abnormal findings: Secondary | ICD-10-CM | POA: Diagnosis not present

## 2015-03-14 DIAGNOSIS — L409 Psoriasis, unspecified: Secondary | ICD-10-CM | POA: Diagnosis not present

## 2015-03-14 DIAGNOSIS — E782 Mixed hyperlipidemia: Secondary | ICD-10-CM | POA: Diagnosis not present

## 2015-03-14 DIAGNOSIS — M838 Other adult osteomalacia: Secondary | ICD-10-CM | POA: Diagnosis not present

## 2015-03-14 DIAGNOSIS — E28319 Asymptomatic premature menopause: Secondary | ICD-10-CM | POA: Diagnosis not present

## 2015-03-14 DIAGNOSIS — D508 Other iron deficiency anemias: Secondary | ICD-10-CM | POA: Diagnosis not present

## 2015-03-14 DIAGNOSIS — Z131 Encounter for screening for diabetes mellitus: Secondary | ICD-10-CM | POA: Diagnosis not present

## 2015-03-14 DIAGNOSIS — Z85038 Personal history of other malignant neoplasm of large intestine: Secondary | ICD-10-CM | POA: Diagnosis not present

## 2015-03-17 ENCOUNTER — Ambulatory Visit (HOSPITAL_COMMUNITY)
Admission: RE | Admit: 2015-03-17 | Discharge: 2015-03-17 | Disposition: A | Discharge status: Home / Self Care | Payer: BLUE CROSS/BLUE SHIELD | Source: Ambulatory Visit | Attending: Family Medicine | Admitting: Family Medicine

## 2015-03-17 ENCOUNTER — Other Ambulatory Visit (HOSPITAL_COMMUNITY): Payer: Self-pay | Admitting: Family Medicine

## 2015-03-17 DIAGNOSIS — Z1231 Encounter for screening mammogram for malignant neoplasm of breast: Secondary | ICD-10-CM | POA: Insufficient documentation

## 2016-03-22 DIAGNOSIS — Z131 Encounter for screening for diabetes mellitus: Secondary | ICD-10-CM | POA: Diagnosis not present

## 2016-03-22 DIAGNOSIS — D508 Other iron deficiency anemias: Secondary | ICD-10-CM | POA: Diagnosis not present

## 2016-03-22 DIAGNOSIS — Z13220 Encounter for screening for lipoid disorders: Secondary | ICD-10-CM | POA: Diagnosis not present

## 2016-03-22 DIAGNOSIS — H6121 Impacted cerumen, right ear: Secondary | ICD-10-CM | POA: Diagnosis not present

## 2016-03-22 DIAGNOSIS — Z2821 Immunization not carried out because of patient refusal: Secondary | ICD-10-CM | POA: Diagnosis not present

## 2016-03-22 DIAGNOSIS — Z0000 Encounter for general adult medical examination without abnormal findings: Secondary | ICD-10-CM | POA: Diagnosis not present

## 2016-03-22 DIAGNOSIS — E782 Mixed hyperlipidemia: Secondary | ICD-10-CM | POA: Diagnosis not present

## 2016-03-22 DIAGNOSIS — Z85038 Personal history of other malignant neoplasm of large intestine: Secondary | ICD-10-CM | POA: Diagnosis not present

## 2016-05-24 ENCOUNTER — Other Ambulatory Visit (HOSPITAL_COMMUNITY): Payer: Self-pay | Admitting: Family Medicine

## 2016-05-24 DIAGNOSIS — Z1239 Encounter for other screening for malignant neoplasm of breast: Secondary | ICD-10-CM

## 2016-05-28 ENCOUNTER — Ambulatory Visit (HOSPITAL_COMMUNITY)
Admission: RE | Admit: 2016-05-28 | Discharge: 2016-05-28 | Disposition: A | Discharge status: Home / Self Care | Payer: BLUE CROSS/BLUE SHIELD | Source: Ambulatory Visit | Attending: Family Medicine | Admitting: Family Medicine

## 2016-05-28 DIAGNOSIS — Z1231 Encounter for screening mammogram for malignant neoplasm of breast: Secondary | ICD-10-CM | POA: Insufficient documentation

## 2016-05-28 DIAGNOSIS — Z1239 Encounter for other screening for malignant neoplasm of breast: Secondary | ICD-10-CM

## 2016-08-05 DIAGNOSIS — R1030 Lower abdominal pain, unspecified: Secondary | ICD-10-CM | POA: Diagnosis not present

## 2017-06-10 ENCOUNTER — Other Ambulatory Visit (HOSPITAL_COMMUNITY): Payer: Self-pay | Admitting: Family Medicine

## 2017-06-10 DIAGNOSIS — Z1231 Encounter for screening mammogram for malignant neoplasm of breast: Secondary | ICD-10-CM

## 2017-06-13 ENCOUNTER — Ambulatory Visit (HOSPITAL_COMMUNITY)
Admission: RE | Admit: 2017-06-13 | Discharge: 2017-06-13 | Disposition: A | Discharge status: Home / Self Care | Payer: Commercial Managed Care - PPO | Source: Ambulatory Visit | Attending: Family Medicine | Admitting: Family Medicine

## 2017-06-13 DIAGNOSIS — Z1231 Encounter for screening mammogram for malignant neoplasm of breast: Secondary | ICD-10-CM | POA: Insufficient documentation

## 2017-06-14 DIAGNOSIS — Z86010 Personal history of colonic polyps: Secondary | ICD-10-CM | POA: Diagnosis not present

## 2017-06-14 DIAGNOSIS — Z85038 Personal history of other malignant neoplasm of large intestine: Secondary | ICD-10-CM | POA: Diagnosis not present

## 2017-06-14 DIAGNOSIS — Z1211 Encounter for screening for malignant neoplasm of colon: Secondary | ICD-10-CM | POA: Diagnosis not present

## 2017-06-14 DIAGNOSIS — K5901 Slow transit constipation: Secondary | ICD-10-CM | POA: Diagnosis not present

## 2018-05-31 ENCOUNTER — Other Ambulatory Visit (HOSPITAL_COMMUNITY): Payer: Self-pay | Admitting: Family Medicine

## 2018-05-31 DIAGNOSIS — Z1231 Encounter for screening mammogram for malignant neoplasm of breast: Secondary | ICD-10-CM

## 2018-06-23 ENCOUNTER — Ambulatory Visit (HOSPITAL_COMMUNITY)
Admission: RE | Admit: 2018-06-23 | Discharge: 2018-06-23 | Disposition: A | Discharge status: Home / Self Care | Payer: Commercial Managed Care - PPO | Source: Ambulatory Visit | Attending: Family Medicine | Admitting: Family Medicine

## 2018-06-23 DIAGNOSIS — Z1231 Encounter for screening mammogram for malignant neoplasm of breast: Secondary | ICD-10-CM

## 2019-04-13 ENCOUNTER — Other Ambulatory Visit: Payer: Self-pay | Admitting: Family Medicine

## 2019-04-13 DIAGNOSIS — M8580 Other specified disorders of bone density and structure, unspecified site: Secondary | ICD-10-CM

## 2019-07-10 ENCOUNTER — Other Ambulatory Visit: Payer: Medicare Other

## 2019-09-17 ENCOUNTER — Ambulatory Visit
Admission: RE | Admit: 2019-09-17 | Discharge: 2019-09-17 | Disposition: A | Discharge status: Home / Self Care | Payer: Medicare Other | Source: Ambulatory Visit | Attending: Family Medicine | Admitting: Family Medicine

## 2019-09-17 ENCOUNTER — Other Ambulatory Visit: Payer: Self-pay

## 2019-09-17 DIAGNOSIS — M8580 Other specified disorders of bone density and structure, unspecified site: Secondary | ICD-10-CM

## 2020-02-15 ENCOUNTER — Other Ambulatory Visit (HOSPITAL_COMMUNITY): Payer: Self-pay | Admitting: Family Medicine

## 2020-02-15 DIAGNOSIS — Z1231 Encounter for screening mammogram for malignant neoplasm of breast: Secondary | ICD-10-CM

## 2020-02-29 ENCOUNTER — Other Ambulatory Visit: Payer: Self-pay

## 2020-02-29 ENCOUNTER — Ambulatory Visit (HOSPITAL_COMMUNITY)
Admission: RE | Admit: 2020-02-29 | Discharge: 2020-02-29 | Disposition: A | Discharge status: Home / Self Care | Payer: Medicare Other | Source: Ambulatory Visit | Attending: Family Medicine | Admitting: Family Medicine

## 2020-02-29 DIAGNOSIS — Z1231 Encounter for screening mammogram for malignant neoplasm of breast: Secondary | ICD-10-CM | POA: Insufficient documentation

## 2021-02-25 ENCOUNTER — Other Ambulatory Visit (HOSPITAL_COMMUNITY): Payer: Self-pay | Admitting: Family Medicine

## 2021-02-25 DIAGNOSIS — Z1231 Encounter for screening mammogram for malignant neoplasm of breast: Secondary | ICD-10-CM

## 2021-03-09 ENCOUNTER — Other Ambulatory Visit: Payer: Self-pay

## 2021-03-09 ENCOUNTER — Ambulatory Visit (HOSPITAL_COMMUNITY)
Admission: RE | Admit: 2021-03-09 | Discharge: 2021-03-09 | Disposition: A | Discharge status: Home / Self Care | Payer: Medicare Other | Source: Ambulatory Visit | Attending: Family Medicine | Admitting: Family Medicine

## 2021-03-09 DIAGNOSIS — Z1231 Encounter for screening mammogram for malignant neoplasm of breast: Secondary | ICD-10-CM

## 2021-04-23 DIAGNOSIS — M8580 Other specified disorders of bone density and structure, unspecified site: Secondary | ICD-10-CM | POA: Diagnosis not present

## 2021-04-23 DIAGNOSIS — Z0000 Encounter for general adult medical examination without abnormal findings: Secondary | ICD-10-CM | POA: Diagnosis not present

## 2021-04-23 DIAGNOSIS — Z85038 Personal history of other malignant neoplasm of large intestine: Secondary | ICD-10-CM | POA: Diagnosis not present

## 2021-04-23 DIAGNOSIS — D508 Other iron deficiency anemias: Secondary | ICD-10-CM | POA: Diagnosis not present

## 2021-04-23 DIAGNOSIS — E7800 Pure hypercholesterolemia, unspecified: Secondary | ICD-10-CM | POA: Diagnosis not present

## 2021-04-29 ENCOUNTER — Other Ambulatory Visit: Payer: Self-pay | Admitting: Family Medicine

## 2021-04-29 DIAGNOSIS — M8580 Other specified disorders of bone density and structure, unspecified site: Secondary | ICD-10-CM

## 2022-02-22 ENCOUNTER — Ambulatory Visit
Admission: RE | Admit: 2022-02-22 | Discharge: 2022-02-22 | Disposition: A | Discharge status: Home / Self Care | Payer: Medicare Other | Source: Ambulatory Visit | Attending: Family Medicine | Admitting: Family Medicine

## 2022-02-22 DIAGNOSIS — Z780 Asymptomatic menopausal state: Secondary | ICD-10-CM | POA: Diagnosis not present

## 2022-02-22 DIAGNOSIS — M8580 Other specified disorders of bone density and structure, unspecified site: Secondary | ICD-10-CM

## 2022-02-22 DIAGNOSIS — M8589 Other specified disorders of bone density and structure, multiple sites: Secondary | ICD-10-CM | POA: Diagnosis not present

## 2022-03-02 ENCOUNTER — Other Ambulatory Visit (HOSPITAL_COMMUNITY): Payer: Self-pay | Admitting: Family Medicine

## 2022-03-02 DIAGNOSIS — Z1231 Encounter for screening mammogram for malignant neoplasm of breast: Secondary | ICD-10-CM

## 2022-03-12 ENCOUNTER — Ambulatory Visit (HOSPITAL_COMMUNITY)
Admission: RE | Admit: 2022-03-12 | Discharge: 2022-03-12 | Disposition: A | Discharge status: Home / Self Care | Payer: Medicare Other | Source: Ambulatory Visit | Attending: Family Medicine | Admitting: Family Medicine

## 2022-03-12 DIAGNOSIS — Z1231 Encounter for screening mammogram for malignant neoplasm of breast: Secondary | ICD-10-CM | POA: Diagnosis not present

## 2022-05-18 DIAGNOSIS — M8588 Other specified disorders of bone density and structure, other site: Secondary | ICD-10-CM | POA: Diagnosis not present

## 2022-05-18 DIAGNOSIS — Z0000 Encounter for general adult medical examination without abnormal findings: Secondary | ICD-10-CM | POA: Diagnosis not present

## 2022-05-18 DIAGNOSIS — E7800 Pure hypercholesterolemia, unspecified: Secondary | ICD-10-CM | POA: Diagnosis not present

## 2022-05-18 DIAGNOSIS — M25562 Pain in left knee: Secondary | ICD-10-CM | POA: Diagnosis not present

## 2022-05-18 DIAGNOSIS — Z85038 Personal history of other malignant neoplasm of large intestine: Secondary | ICD-10-CM | POA: Diagnosis not present

## 2022-05-18 DIAGNOSIS — M8580 Other specified disorders of bone density and structure, unspecified site: Secondary | ICD-10-CM | POA: Diagnosis not present

## 2022-05-18 DIAGNOSIS — D508 Other iron deficiency anemias: Secondary | ICD-10-CM | POA: Diagnosis not present

## 2022-05-20 DIAGNOSIS — D509 Iron deficiency anemia, unspecified: Secondary | ICD-10-CM | POA: Diagnosis not present

## 2022-05-20 DIAGNOSIS — Z85038 Personal history of other malignant neoplasm of large intestine: Secondary | ICD-10-CM | POA: Diagnosis not present

## 2022-05-20 DIAGNOSIS — Z86010 Personal history of colonic polyps: Secondary | ICD-10-CM | POA: Diagnosis not present

## 2022-05-27 DIAGNOSIS — M25562 Pain in left knee: Secondary | ICD-10-CM | POA: Diagnosis not present

## 2022-08-04 DIAGNOSIS — Z86010 Personal history of colonic polyps: Secondary | ICD-10-CM | POA: Diagnosis not present

## 2022-08-04 DIAGNOSIS — Z980 Intestinal bypass and anastomosis status: Secondary | ICD-10-CM | POA: Diagnosis not present

## 2022-08-04 DIAGNOSIS — Z85038 Personal history of other malignant neoplasm of large intestine: Secondary | ICD-10-CM | POA: Diagnosis not present

## 2022-08-04 DIAGNOSIS — K5730 Diverticulosis of large intestine without perforation or abscess without bleeding: Secondary | ICD-10-CM | POA: Diagnosis not present

## 2022-10-24 IMAGING — MG MM DIGITAL SCREENING BILAT W/ TOMO AND CAD
8 series · 8 of 24 positions shown · non-contrast
Comparison: Previous exam(s).

CLINICAL DATA: Screening.

EXAM:
DIGITAL SCREENING BILATERAL MAMMOGRAM WITH TOMOSYNTHESIS AND CAD
TECHNIQUE: Bilateral screening digital craniocaudal and mediolateral oblique
mammograms were obtained. Bilateral screening digital breast
tomosynthesis was performed. The images were evaluated with
computer-aided detection.

[R MLO synth-2D]
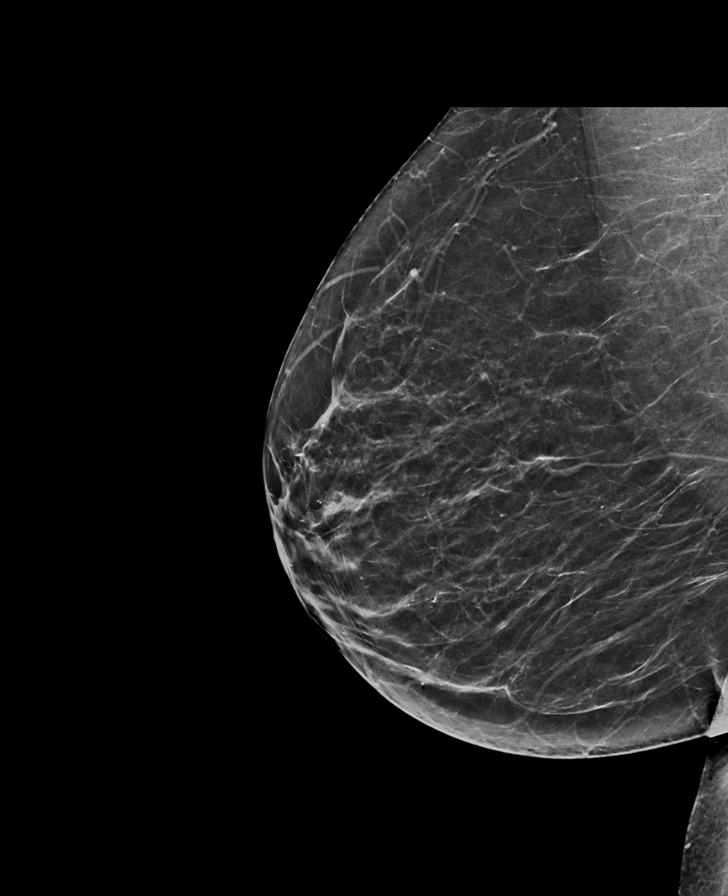

[L MLO synth-2D]
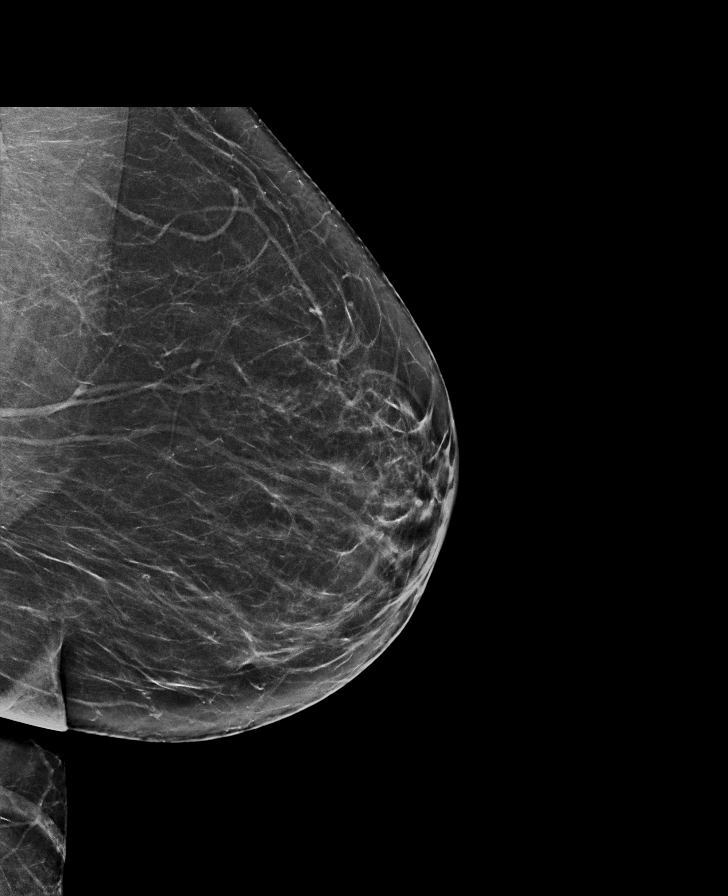

[L CC synth-2D]
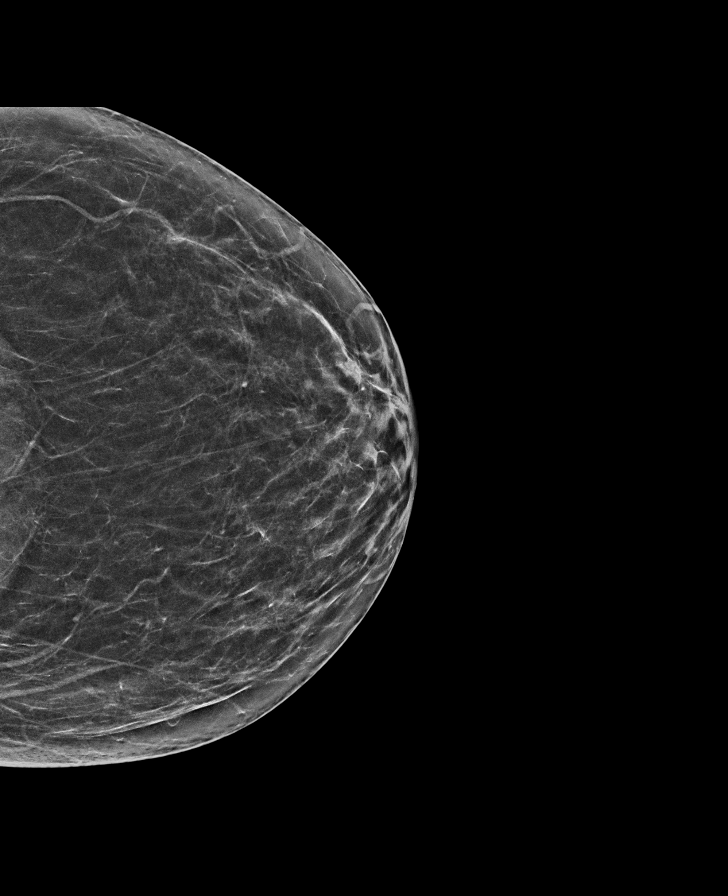

[R CC synth-2D]
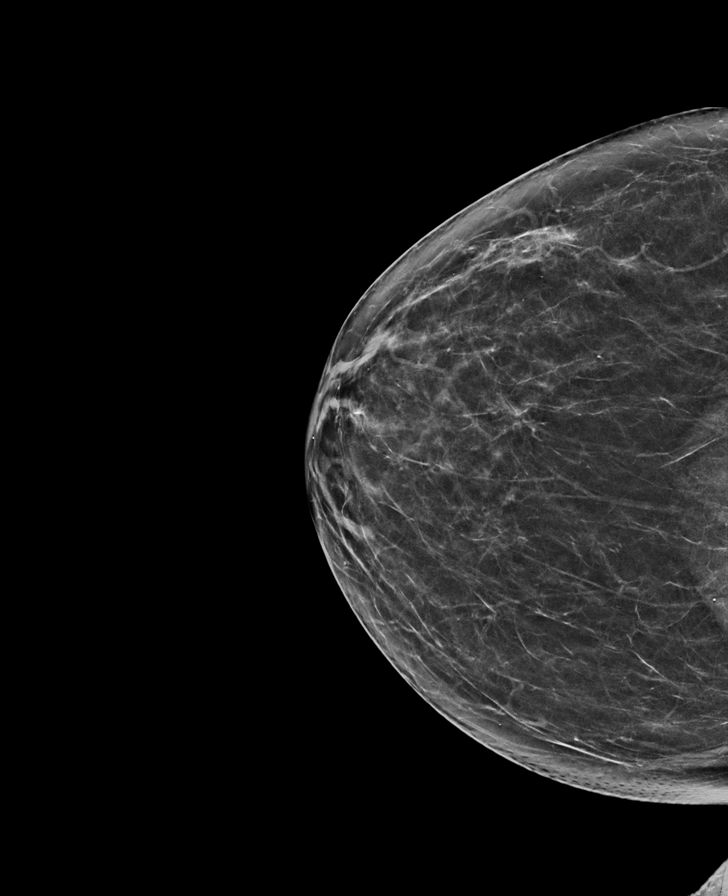

[L CC tomo · tomo slice 31/60.0]
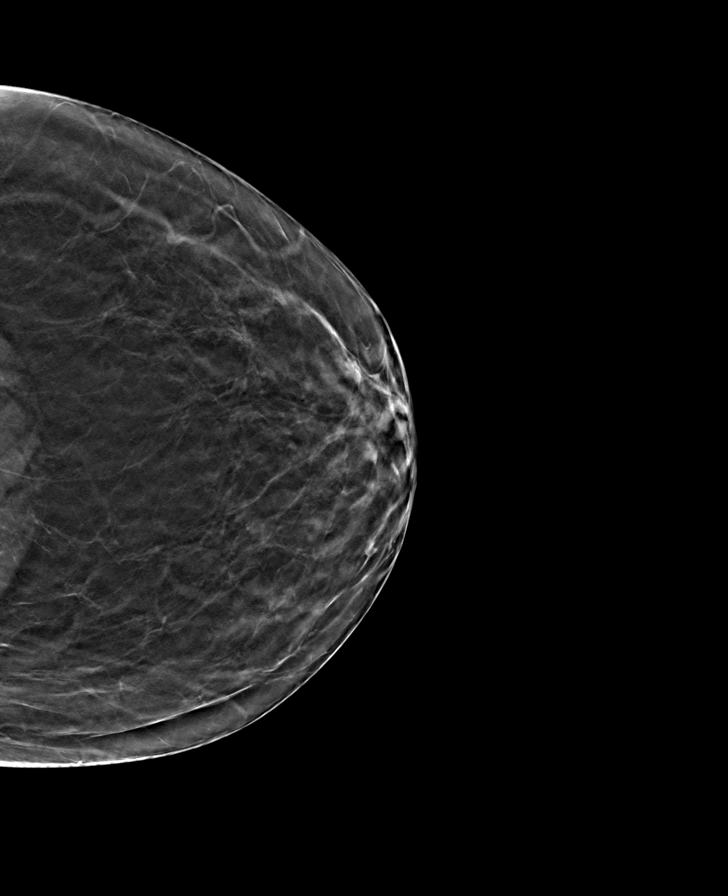

[R CC tomo · tomo slice 33/64.0]
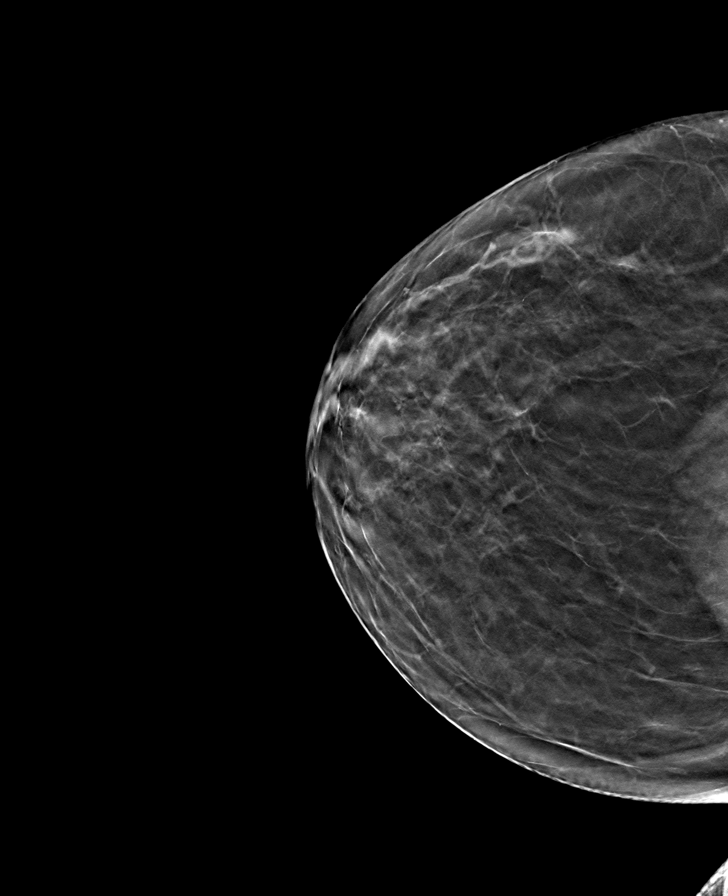

[L MLO tomo · tomo slice 34/67.0]
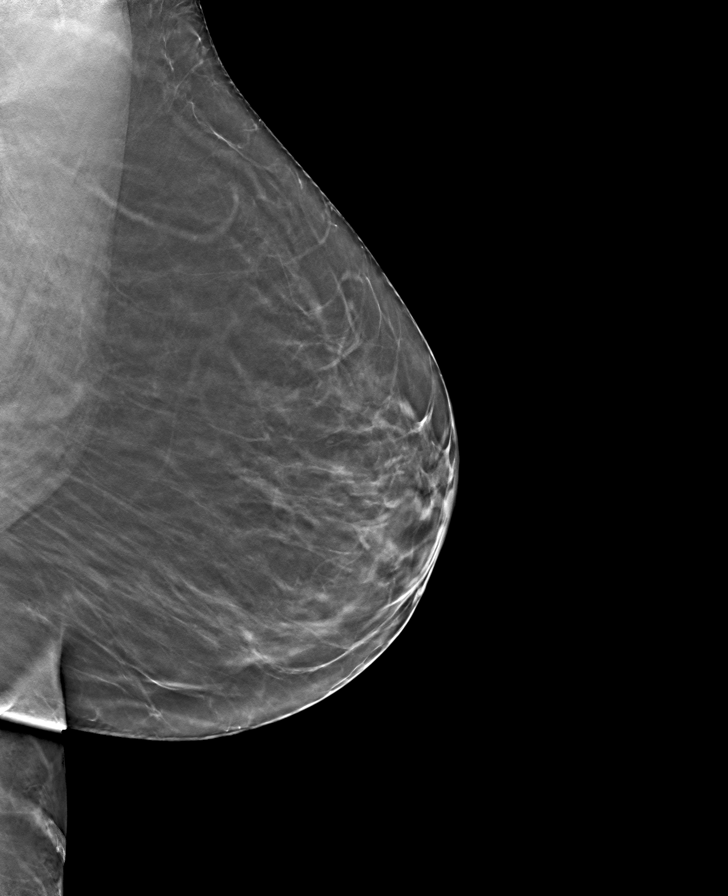

[R MLO tomo · tomo slice 32/63.0]
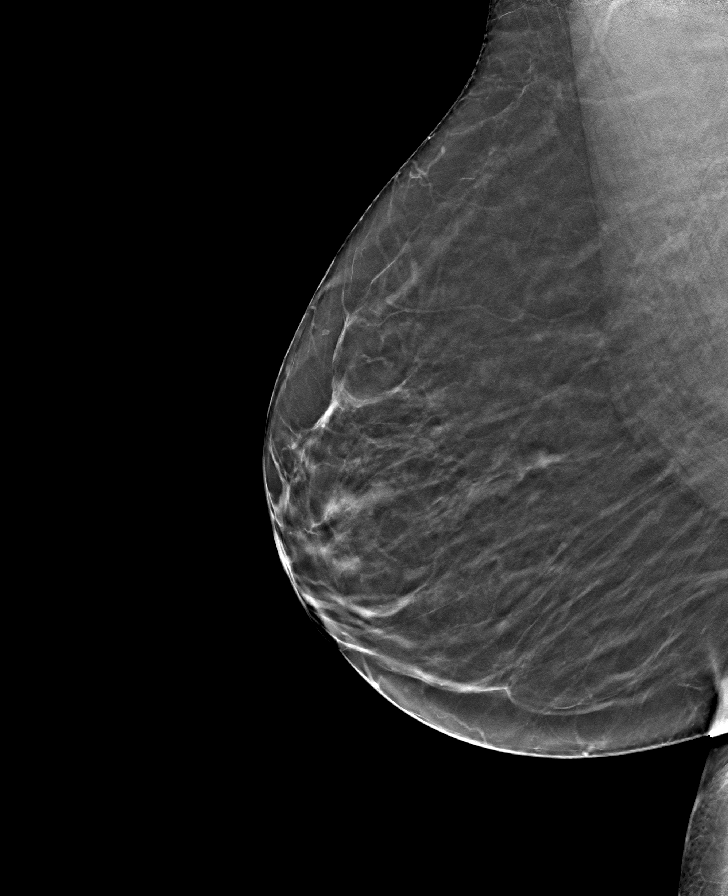

[8 of 24 positions shown; findings below may reference images not displayed]

ACR Breast Density Category b: There are scattered areas of
fibroglandular density.
FINDINGS: There are no findings suspicious for malignancy.
IMPRESSION: No mammographic evidence of malignancy. A result letter of this
screening mammogram will be mailed directly to the patient.

RECOMMENDATION:
Screening mammogram in one year. (Code:51-O-LD2)

BI-RADS CATEGORY  1: Negative.

## 2022-11-09 DIAGNOSIS — H6121 Impacted cerumen, right ear: Secondary | ICD-10-CM | POA: Diagnosis not present

## 2022-11-09 DIAGNOSIS — H6503 Acute serous otitis media, bilateral: Secondary | ICD-10-CM | POA: Diagnosis not present

## 2023-01-04 DIAGNOSIS — L309 Dermatitis, unspecified: Secondary | ICD-10-CM | POA: Diagnosis not present

## 2023-02-02 DIAGNOSIS — L309 Dermatitis, unspecified: Secondary | ICD-10-CM | POA: Diagnosis not present

## 2023-02-28 ENCOUNTER — Other Ambulatory Visit (HOSPITAL_COMMUNITY): Payer: Self-pay | Admitting: Family Medicine

## 2023-02-28 DIAGNOSIS — Z1231 Encounter for screening mammogram for malignant neoplasm of breast: Secondary | ICD-10-CM

## 2023-03-16 ENCOUNTER — Ambulatory Visit (HOSPITAL_COMMUNITY)
Admission: RE | Admit: 2023-03-16 | Discharge: 2023-03-16 | Disposition: A | Discharge status: Home / Self Care | Payer: Medicare Other | Source: Ambulatory Visit | Attending: Family Medicine | Admitting: Family Medicine

## 2023-03-16 ENCOUNTER — Encounter (HOSPITAL_COMMUNITY): Payer: Self-pay

## 2023-03-16 DIAGNOSIS — Z1231 Encounter for screening mammogram for malignant neoplasm of breast: Secondary | ICD-10-CM | POA: Diagnosis not present

## 2023-05-26 DIAGNOSIS — D508 Other iron deficiency anemias: Secondary | ICD-10-CM | POA: Diagnosis not present

## 2023-05-26 DIAGNOSIS — M8588 Other specified disorders of bone density and structure, other site: Secondary | ICD-10-CM | POA: Diagnosis not present

## 2023-05-26 DIAGNOSIS — Z0000 Encounter for general adult medical examination without abnormal findings: Secondary | ICD-10-CM | POA: Diagnosis not present

## 2023-05-26 DIAGNOSIS — E7800 Pure hypercholesterolemia, unspecified: Secondary | ICD-10-CM | POA: Diagnosis not present

## 2023-05-26 DIAGNOSIS — Z85038 Personal history of other malignant neoplasm of large intestine: Secondary | ICD-10-CM | POA: Diagnosis not present

## 2023-05-26 DIAGNOSIS — M8580 Other specified disorders of bone density and structure, unspecified site: Secondary | ICD-10-CM | POA: Diagnosis not present

## 2023-08-30 DIAGNOSIS — Z860100 Personal history of colon polyps, unspecified: Secondary | ICD-10-CM | POA: Diagnosis not present

## 2023-08-30 DIAGNOSIS — Z85038 Personal history of other malignant neoplasm of large intestine: Secondary | ICD-10-CM | POA: Diagnosis not present

## 2023-08-30 DIAGNOSIS — D509 Iron deficiency anemia, unspecified: Secondary | ICD-10-CM | POA: Diagnosis not present

## 2023-08-30 DIAGNOSIS — Z1211 Encounter for screening for malignant neoplasm of colon: Secondary | ICD-10-CM | POA: Diagnosis not present

## 2023-08-30 DIAGNOSIS — K5730 Diverticulosis of large intestine without perforation or abscess without bleeding: Secondary | ICD-10-CM | POA: Diagnosis not present

## 2023-09-19 DIAGNOSIS — Z85038 Personal history of other malignant neoplasm of large intestine: Secondary | ICD-10-CM | POA: Diagnosis not present

## 2023-09-19 DIAGNOSIS — K5730 Diverticulosis of large intestine without perforation or abscess without bleeding: Secondary | ICD-10-CM | POA: Diagnosis not present

## 2023-09-19 DIAGNOSIS — Z860101 Personal history of adenomatous and serrated colon polyps: Secondary | ICD-10-CM | POA: Diagnosis not present

## 2023-09-19 DIAGNOSIS — Z1211 Encounter for screening for malignant neoplasm of colon: Secondary | ICD-10-CM | POA: Diagnosis not present

## 2023-10-03 DIAGNOSIS — R030 Elevated blood-pressure reading, without diagnosis of hypertension: Secondary | ICD-10-CM | POA: Diagnosis not present

## 2023-10-03 DIAGNOSIS — A084 Viral intestinal infection, unspecified: Secondary | ICD-10-CM | POA: Diagnosis not present

## 2024-05-14 ENCOUNTER — Other Ambulatory Visit (HOSPITAL_COMMUNITY): Payer: Self-pay | Admitting: Family Medicine

## 2024-05-14 DIAGNOSIS — Z1231 Encounter for screening mammogram for malignant neoplasm of breast: Secondary | ICD-10-CM

## 2024-05-21 ENCOUNTER — Ambulatory Visit (HOSPITAL_COMMUNITY)
Admission: RE | Admit: 2024-05-21 | Discharge: 2024-05-21 | Disposition: A | Discharge status: Home / Self Care | Source: Ambulatory Visit | Attending: Family Medicine | Admitting: Family Medicine

## 2024-05-21 DIAGNOSIS — Z1231 Encounter for screening mammogram for malignant neoplasm of breast: Secondary | ICD-10-CM | POA: Diagnosis not present

## 2024-06-07 ENCOUNTER — Other Ambulatory Visit (HOSPITAL_BASED_OUTPATIENT_CLINIC_OR_DEPARTMENT_OTHER): Payer: Self-pay | Admitting: Family Medicine

## 2024-06-07 DIAGNOSIS — D508 Other iron deficiency anemias: Secondary | ICD-10-CM | POA: Diagnosis not present

## 2024-06-07 DIAGNOSIS — Z79899 Other long term (current) drug therapy: Secondary | ICD-10-CM | POA: Diagnosis not present

## 2024-06-07 DIAGNOSIS — Z23 Encounter for immunization: Secondary | ICD-10-CM | POA: Diagnosis not present

## 2024-06-07 DIAGNOSIS — M8580 Other specified disorders of bone density and structure, unspecified site: Secondary | ICD-10-CM

## 2024-06-07 DIAGNOSIS — Z0000 Encounter for general adult medical examination without abnormal findings: Secondary | ICD-10-CM | POA: Diagnosis not present

## 2024-06-07 DIAGNOSIS — Z85038 Personal history of other malignant neoplasm of large intestine: Secondary | ICD-10-CM | POA: Diagnosis not present

## 2024-06-07 DIAGNOSIS — E7800 Pure hypercholesterolemia, unspecified: Secondary | ICD-10-CM | POA: Diagnosis not present
